# Patient Record
Sex: Female | Born: 1960
Health system: Southern US, Community
[De-identification: ages and names within clinical notes are randomized; demographics above are authoritative.]

## PROBLEM LIST (undated history)

## (undated) DIAGNOSIS — I10 Essential (primary) hypertension: Secondary | ICD-10-CM

---

## 2007-04-25 ENCOUNTER — Emergency Department (HOSPITAL_COMMUNITY): Admission: EM | Admit: 2007-04-25 | Discharge: 2007-04-26 | Payer: Self-pay | Admitting: Emergency Medicine

## 2007-04-29 ENCOUNTER — Ambulatory Visit: Payer: Self-pay | Admitting: Internal Medicine

## 2007-04-29 ENCOUNTER — Inpatient Hospital Stay (HOSPITAL_COMMUNITY): Admission: EM | Admit: 2007-04-29 | Discharge: 2007-04-30 | Payer: Self-pay | Admitting: Internal Medicine

## 2008-01-28 HISTORY — PX: LUNG SURGERY: SHX703

## 2008-07-20 ENCOUNTER — Ambulatory Visit (HOSPITAL_COMMUNITY): Admission: RE | Admit: 2008-07-20 | Discharge: 2008-07-20 | Payer: Self-pay | Admitting: Family Medicine

## 2008-07-21 ENCOUNTER — Inpatient Hospital Stay (HOSPITAL_COMMUNITY): Admission: EM | Admit: 2008-07-21 | Discharge: 2008-08-06 | Payer: Self-pay | Admitting: Emergency Medicine

## 2008-07-25 ENCOUNTER — Ambulatory Visit: Payer: Self-pay | Admitting: Cardiology

## 2008-07-25 ENCOUNTER — Encounter (INDEPENDENT_AMBULATORY_CARE_PROVIDER_SITE_OTHER): Payer: Self-pay | Admitting: Internal Medicine

## 2008-07-29 ENCOUNTER — Ambulatory Visit: Payer: Self-pay | Admitting: Cardiothoracic Surgery

## 2008-07-31 ENCOUNTER — Encounter: Payer: Self-pay | Admitting: Cardiothoracic Surgery

## 2008-08-01 ENCOUNTER — Encounter: Payer: Self-pay | Admitting: Cardiothoracic Surgery

## 2008-08-15 ENCOUNTER — Encounter: Admission: RE | Admit: 2008-08-15 | Discharge: 2008-08-15 | Payer: Self-pay | Admitting: Cardiothoracic Surgery

## 2008-08-15 ENCOUNTER — Ambulatory Visit: Payer: Self-pay | Admitting: Cardiothoracic Surgery

## 2008-08-31 ENCOUNTER — Ambulatory Visit: Payer: Self-pay | Admitting: Cardiothoracic Surgery

## 2008-08-31 ENCOUNTER — Encounter: Admission: RE | Admit: 2008-08-31 | Discharge: 2008-08-31 | Payer: Self-pay | Admitting: Cardiothoracic Surgery

## 2009-08-08 IMAGING — CR DG CHEST 2V
2 series · 2 of 2 positions shown · non-contrast
Comparison: 08/15/2008

CLINICAL DATA: Post surgery for left empyema

CHEST - 2 VIEW

[w chest pa]
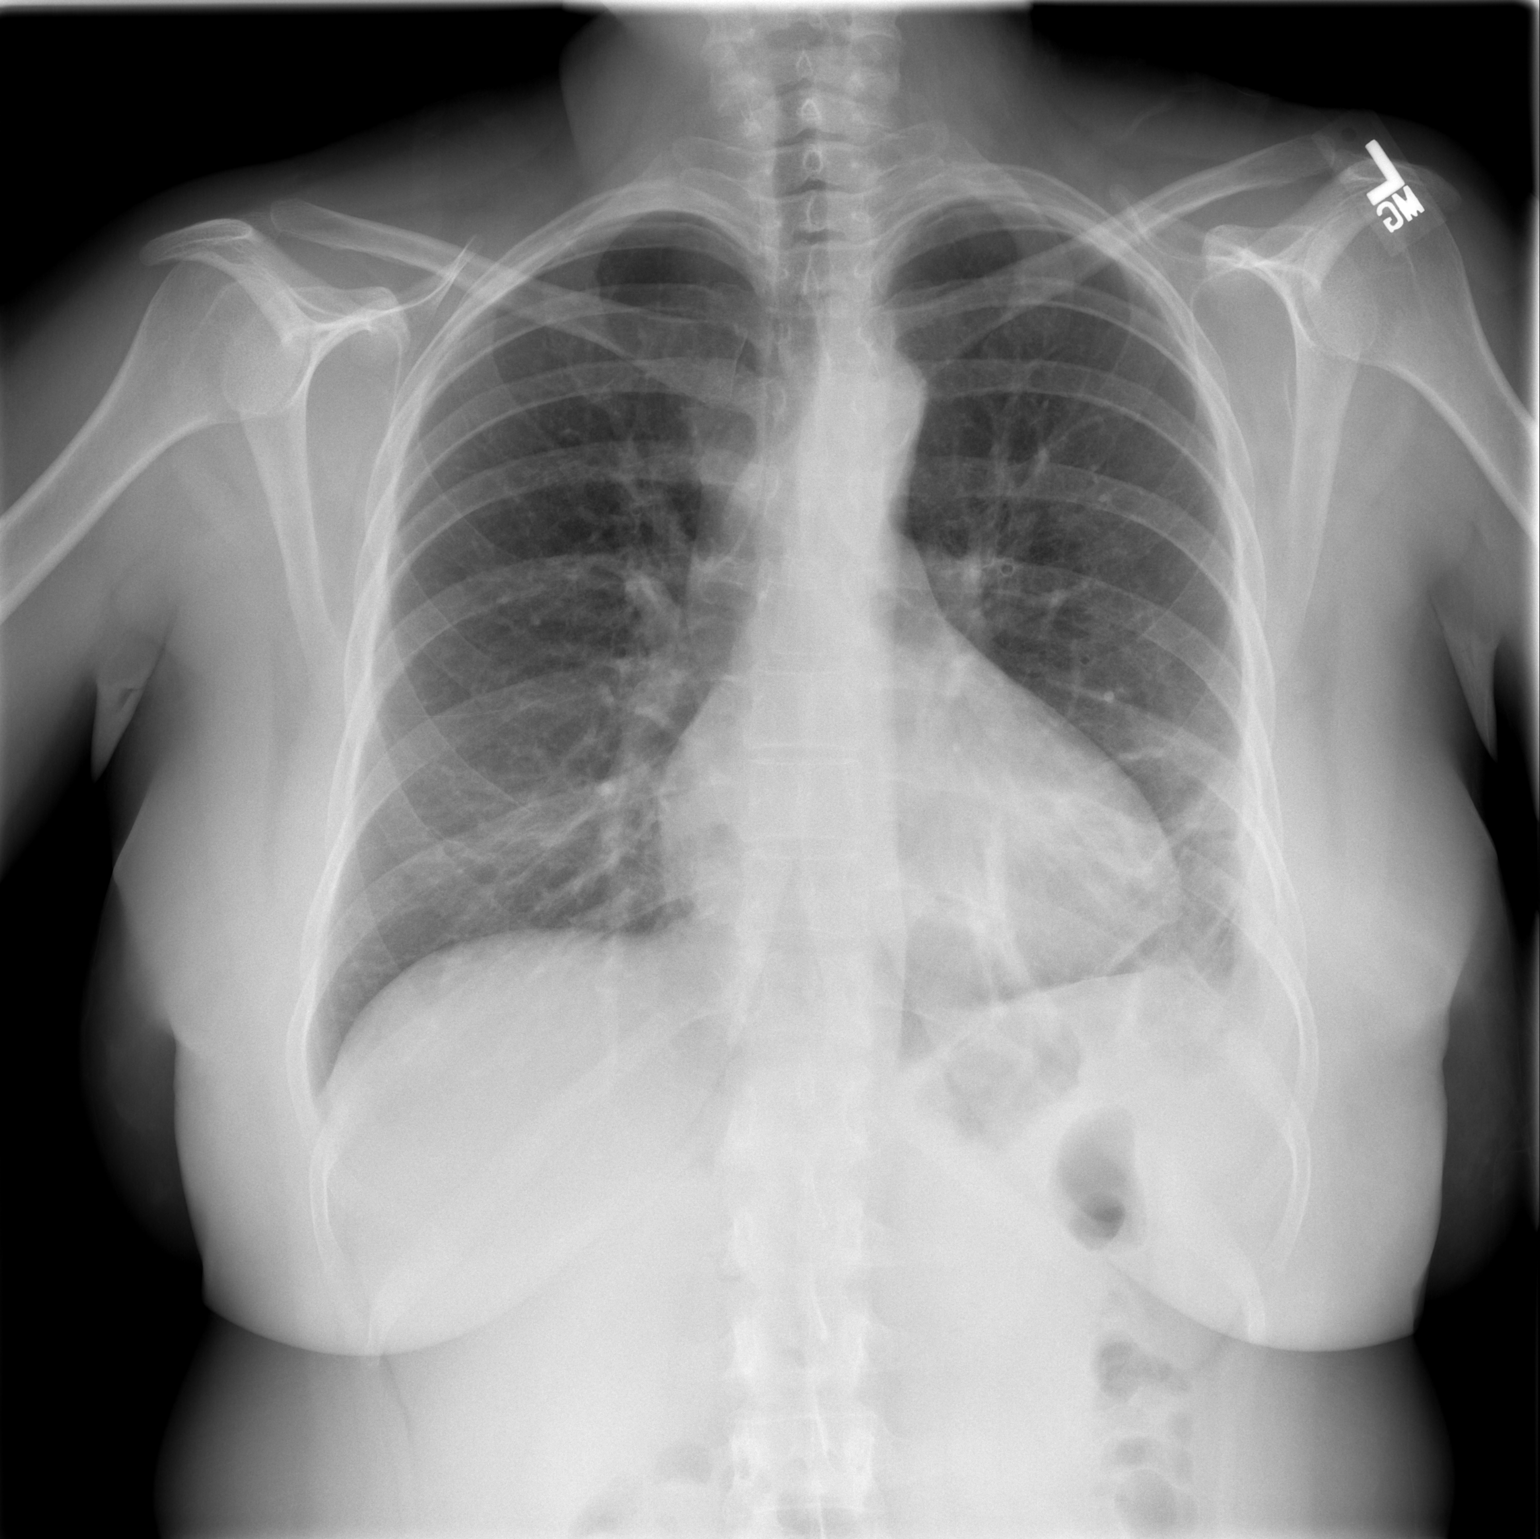

[w chest lat]
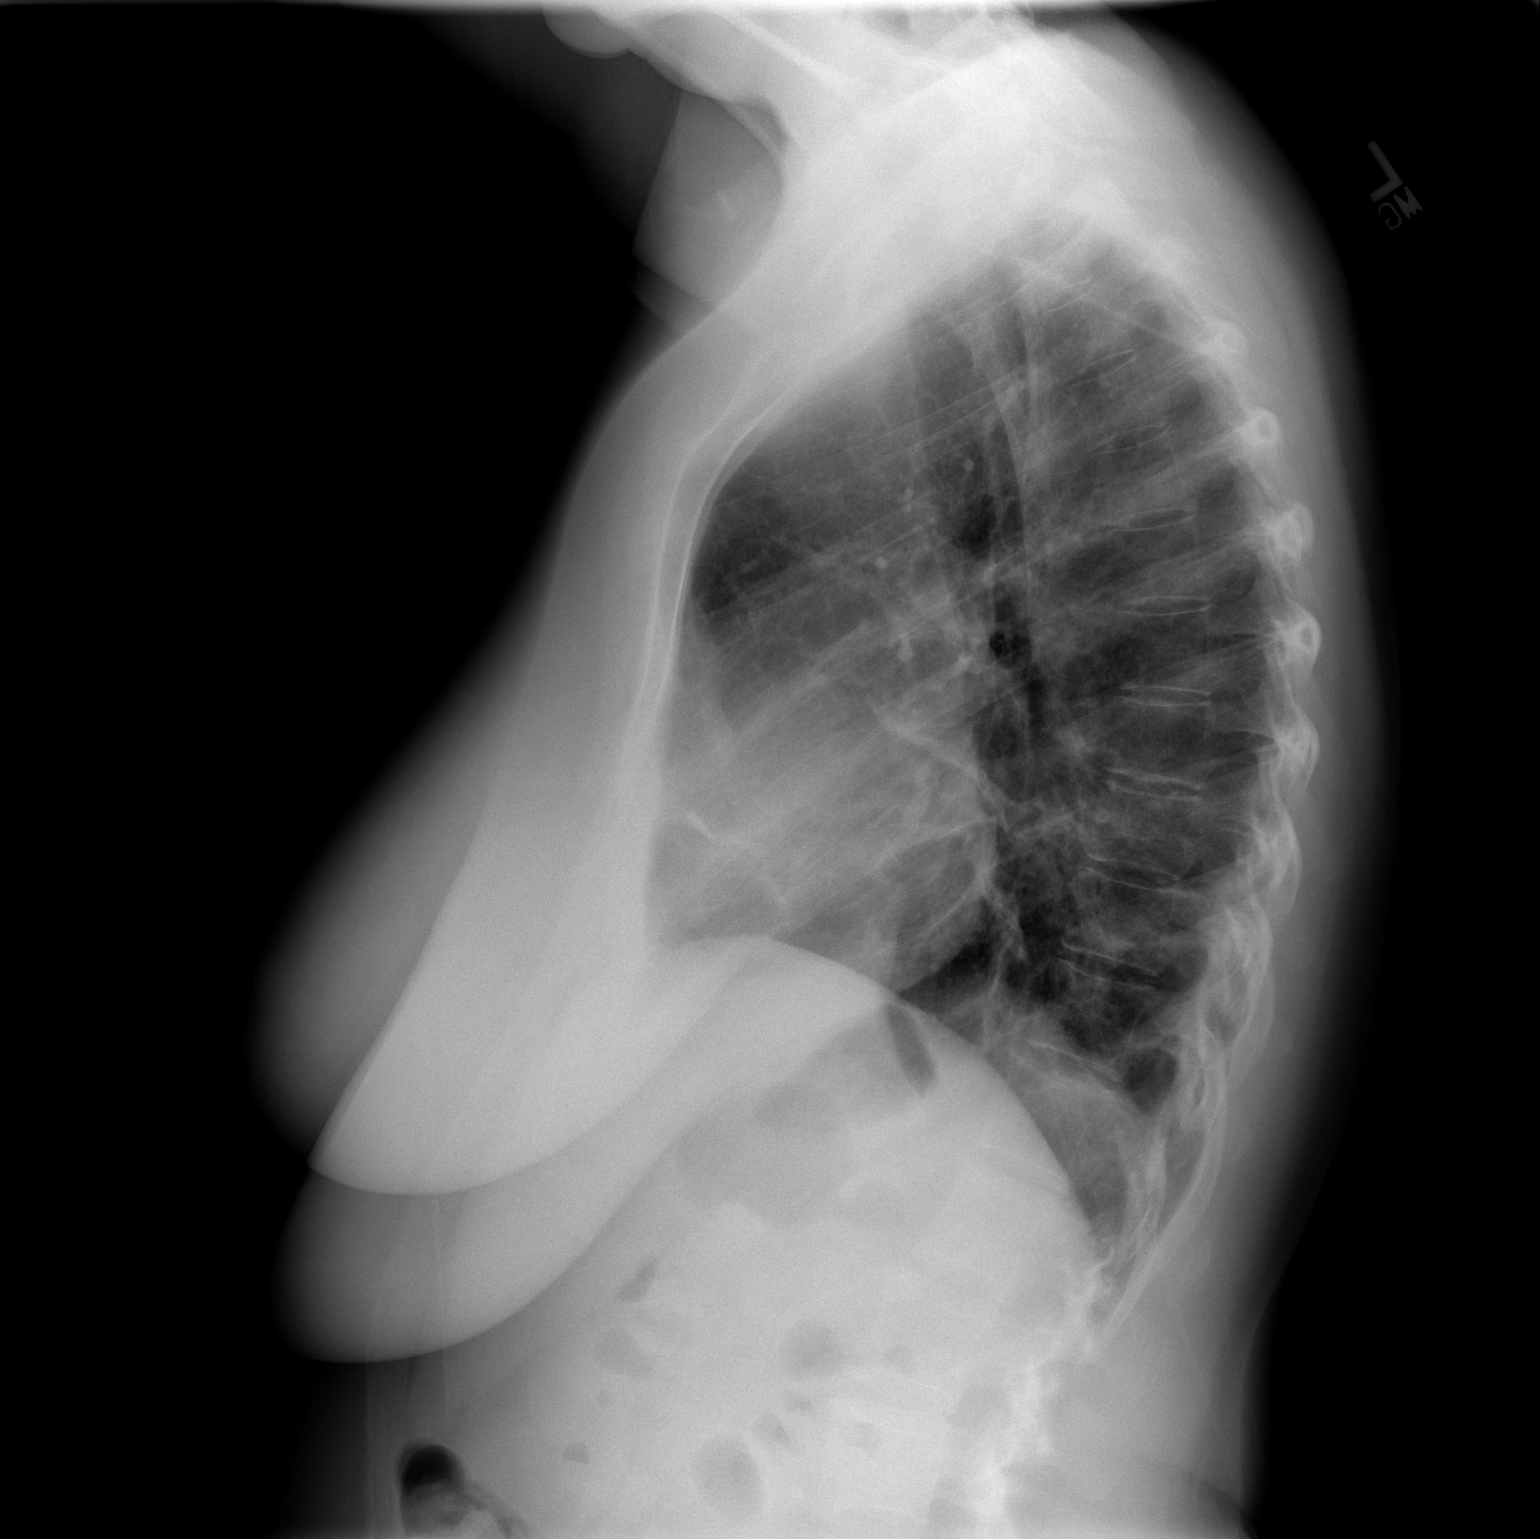

[2 of 2 positions shown; findings below may reference images not displayed]

FINDINGS: Continued improvement of aeration of the left lung base.
There is still minimal scarring/plate-like atelectasis, and minimal
residual pleural reaction.  No new findings or active disease.
Heart and mediastinal contours normal.
IMPRESSION: Continued improvement at the left base - no active process or new
findings.

## 2010-05-05 LAB — CBC
HCT: 30.4 % — ABNORMAL LOW (ref 36.0–46.0)
HCT: 31.2 % — ABNORMAL LOW (ref 36.0–46.0)
HCT: 31.8 % — ABNORMAL LOW (ref 36.0–46.0)
HCT: 34.4 % — ABNORMAL LOW (ref 36.0–46.0)
HCT: 34.9 % — ABNORMAL LOW (ref 36.0–46.0)
HCT: 32.7 % — ABNORMAL LOW (ref 36.0–46.0)
HCT: 35.6 % — ABNORMAL LOW (ref 36.0–46.0)
Hemoglobin: 10.3 g/dL — ABNORMAL LOW (ref 12.0–15.0)
Hemoglobin: 10.8 g/dL — ABNORMAL LOW (ref 12.0–15.0)
Hemoglobin: 10.9 g/dL — ABNORMAL LOW (ref 12.0–15.0)
Hemoglobin: 11.6 g/dL — ABNORMAL LOW (ref 12.0–15.0)
Hemoglobin: 11.9 g/dL — ABNORMAL LOW (ref 12.0–15.0)
Hemoglobin: 11.9 g/dL — ABNORMAL LOW (ref 12.0–15.0)
Hemoglobin: 12.3 g/dL (ref 12.0–15.0)
MCHC: 33.7 g/dL (ref 30.0–36.0)
MCHC: 33.7 g/dL (ref 30.0–36.0)
MCHC: 34.2 g/dL (ref 30.0–36.0)
MCHC: 34.2 g/dL (ref 30.0–36.0)
MCHC: 34.8 g/dL (ref 30.0–36.0)
MCHC: 35.3 g/dL (ref 30.0–36.0)
MCV: 93.9 fL (ref 78.0–100.0)
MCV: 94.3 fL (ref 78.0–100.0)
MCV: 94.3 fL (ref 78.0–100.0)
MCV: 94.7 fL (ref 78.0–100.0)
MCV: 94.9 fL (ref 78.0–100.0)
MCV: 93.6 fL (ref 78.0–100.0)
MCV: 94.3 fL (ref 78.0–100.0)
Platelets: 691 10*3/uL — ABNORMAL HIGH (ref 150–400)
Platelets: 737 10*3/uL — ABNORMAL HIGH (ref 150–400)
Platelets: 756 10*3/uL — ABNORMAL HIGH (ref 150–400)
Platelets: 778 10*3/uL — ABNORMAL HIGH (ref 150–400)
Platelets: 843 10*3/uL — ABNORMAL HIGH (ref 150–400)
Platelets: 718 10*3/uL — ABNORMAL HIGH (ref 150–400)
RBC: 3.21 MIL/uL — ABNORMAL LOW (ref 3.87–5.11)
RBC: 3.29 MIL/uL — ABNORMAL LOW (ref 3.87–5.11)
RBC: 3.37 MIL/uL — ABNORMAL LOW (ref 3.87–5.11)
RBC: 3.67 MIL/uL — ABNORMAL LOW (ref 3.87–5.11)
RBC: 3.7 MIL/uL — ABNORMAL LOW (ref 3.87–5.11)
RBC: 3.47 MIL/uL — ABNORMAL LOW (ref 3.87–5.11)
RBC: 3.56 MIL/uL — ABNORMAL LOW (ref 3.87–5.11)
RDW: 12.9 % (ref 11.5–15.5)
RDW: 13.1 % (ref 11.5–15.5)
RDW: 13.2 % (ref 11.5–15.5)
RDW: 13.4 % (ref 11.5–15.5)
RDW: 13.4 % (ref 11.5–15.5)
RDW: 13.3 % (ref 11.5–15.5)
WBC: 10 10*3/uL (ref 4.0–10.5)
WBC: 10.3 10*3/uL (ref 4.0–10.5)
WBC: 11.8 10*3/uL — ABNORMAL HIGH (ref 4.0–10.5)
WBC: 13.7 10*3/uL — ABNORMAL HIGH (ref 4.0–10.5)
WBC: 8.1 10*3/uL (ref 4.0–10.5)
WBC: 11.2 10*3/uL — ABNORMAL HIGH (ref 4.0–10.5)
WBC: 11.2 10*3/uL — ABNORMAL HIGH (ref 4.0–10.5)
WBC: 9.8 10*3/uL (ref 4.0–10.5)

## 2010-05-05 LAB — BASIC METABOLIC PANEL
BUN: 2 mg/dL — ABNORMAL LOW (ref 6–23)
BUN: 3 mg/dL — ABNORMAL LOW (ref 6–23)
BUN: <1 mg/dL — ABNORMAL LOW (ref 6–23)
CO2: 28 mEq/L (ref 19–32)
CO2: 28 mEq/L (ref 19–32)
CO2: 29 mEq/L (ref 19–32)
CO2: 25 mEq/L (ref 19–32)
Calcium: 7.8 mg/dL — ABNORMAL LOW (ref 8.4–10.5)
Calcium: 8.3 mg/dL — ABNORMAL LOW (ref 8.4–10.5)
Calcium: 8.3 mg/dL — ABNORMAL LOW (ref 8.4–10.5)
Calcium: 8.1 mg/dL — ABNORMAL LOW (ref 8.4–10.5)
Chloride: 100 mEq/L (ref 96–112)
Chloride: 100 mEq/L (ref 96–112)
Chloride: 104 mEq/L (ref 96–112)
Chloride: 108 mEq/L (ref 96–112)
Creatinine, Ser: 0.52 mg/dL (ref 0.4–1.2)
Creatinine, Ser: 0.59 mg/dL (ref 0.4–1.2)
Creatinine, Ser: 0.6 mg/dL (ref 0.4–1.2)
GFR calc Af Amer: >60 mL/min (ref >60)
GFR calc Af Amer: >60 mL/min (ref >60)
GFR calc Af Amer: >60 mL/min (ref >60)
GFR calc Af Amer: >60 mL/min (ref >60)
GFR calc Af Amer: >60 mL/min (ref >60)
GFR calc non Af Amer: >60 mL/min (ref >60)
GFR calc non Af Amer: >60 mL/min (ref >60)
GFR calc non Af Amer: >60 mL/min (ref >60)
GFR calc non Af Amer: >60 mL/min (ref >60)
GFR calc non Af Amer: >60 mL/min (ref >60)
Glucose, Bld: 126 mg/dL — ABNORMAL HIGH (ref 70–99)
Glucose, Bld: 133 mg/dL — ABNORMAL HIGH (ref 70–99)
Glucose, Bld: 136 mg/dL — ABNORMAL HIGH (ref 70–99)
Potassium: 4.1 mEq/L (ref 3.5–5.1)
Potassium: 4.2 mEq/L (ref 3.5–5.1)
Potassium: 4.2 mEq/L (ref 3.5–5.1)
Potassium: 3.6 mEq/L (ref 3.5–5.1)
Sodium: 134 mEq/L — ABNORMAL LOW (ref 135–145)
Sodium: 135 mEq/L (ref 135–145)
Sodium: 138 mEq/L (ref 135–145)
Sodium: 138 mEq/L (ref 135–145)
Sodium: 139 mEq/L (ref 135–145)

## 2010-05-05 LAB — POTASSIUM: Potassium: 3.6 mEq/L (ref 3.5–5.1)

## 2010-05-05 LAB — BLOOD GAS, ARTERIAL
Acid-Base Excess: 1.7 mmol/L (ref 0.0–2.0)
Acid-base deficit: 2.2 mmol/L — ABNORMAL HIGH (ref 0.0–2.0)
Bicarbonate: 23.2 mEq/L (ref 20.0–24.0)
Bicarbonate: 25.2 mEq/L — ABNORMAL HIGH (ref 20.0–24.0)
Drawn by: 280971
O2 Saturation: 90.5 %
O2 Saturation: 91.2 %
Patient temperature: 97.3
Patient temperature: 97.8
TCO2: 24.7 mmol/L (ref 0–100)
TCO2: 26.3 mmol/L (ref 0–100)
pCO2 arterial: 34.8 mmHg — ABNORMAL LOW (ref 35.0–45.0)
pCO2 arterial: 46.6 mmHg — ABNORMAL HIGH (ref 35.0–45.0)
pH, Arterial: 7.314 — ABNORMAL LOW (ref 7.350–7.400)
pH, Arterial: 7.471 — ABNORMAL HIGH (ref 7.350–7.400)
pO2, Arterial: 58.3 mmHg — ABNORMAL LOW (ref 80.0–100.0)
pO2, Arterial: 69 mmHg — ABNORMAL LOW (ref 80.0–100.0)

## 2010-05-05 LAB — APTT: aPTT: 42 seconds — ABNORMAL HIGH (ref 24–37)

## 2010-05-05 LAB — URINALYSIS, MICROSCOPIC ONLY
Bilirubin Urine: NEGATIVE
Glucose, UA: NEGATIVE mg/dL
Hgb urine dipstick: NEGATIVE
Ketones, ur: NEGATIVE mg/dL
Leukocytes, UA: NEGATIVE
Nitrite: NEGATIVE
Protein, ur: NEGATIVE mg/dL
Specific Gravity, Urine: 1.024 (ref 1.005–1.030)
Urobilinogen, UA: 0.2 mg/dL (ref 0.0–1.0)
pH: 6 (ref 5.0–8.0)

## 2010-05-05 LAB — CROSSMATCH
ABO/RH(D): A POS
Antibody Screen: NEGATIVE

## 2010-05-05 LAB — COMPREHENSIVE METABOLIC PANEL
ALT: 35 U/L (ref 0–35)
ALT: 79 U/L — ABNORMAL HIGH (ref 0–35)
AST: 16 U/L (ref 0–37)
AST: 28 U/L (ref 0–37)
Albumin: 1.9 g/dL — ABNORMAL LOW (ref 3.5–5.2)
Albumin: 2.3 g/dL — ABNORMAL LOW (ref 3.5–5.2)
Albumin: 2.5 g/dL — ABNORMAL LOW (ref 3.5–5.2)
Alkaline Phosphatase: 112 U/L (ref 39–117)
Alkaline Phosphatase: 68 U/L (ref 39–117)
Alkaline Phosphatase: 124 U/L — ABNORMAL HIGH (ref 39–117)
BUN: 3 mg/dL — ABNORMAL LOW (ref 6–23)
BUN: <1 mg/dL — ABNORMAL LOW (ref 6–23)
BUN: 4 mg/dL — ABNORMAL LOW (ref 6–23)
CO2: 27 mEq/L (ref 19–32)
CO2: 28 mEq/L (ref 19–32)
Calcium: 7.7 mg/dL — ABNORMAL LOW (ref 8.4–10.5)
Calcium: 8.2 mg/dL — ABNORMAL LOW (ref 8.4–10.5)
Chloride: 102 mEq/L (ref 96–112)
Chloride: 102 mEq/L (ref 96–112)
Chloride: 105 mEq/L (ref 96–112)
Creatinine, Ser: 0.58 mg/dL (ref 0.4–1.2)
Creatinine, Ser: 0.6 mg/dL (ref 0.4–1.2)
Creatinine, Ser: 0.61 mg/dL (ref 0.4–1.2)
GFR calc Af Amer: >60 mL/min (ref >60)
GFR calc Af Amer: >60 mL/min (ref >60)
GFR calc non Af Amer: >60 mL/min (ref >60)
GFR calc non Af Amer: >60 mL/min (ref >60)
GFR calc non Af Amer: >60 mL/min (ref >60)
Glucose, Bld: 117 mg/dL — ABNORMAL HIGH (ref 70–99)
Glucose, Bld: 137 mg/dL — ABNORMAL HIGH (ref 70–99)
Glucose, Bld: 101 mg/dL — ABNORMAL HIGH (ref 70–99)
Potassium: 3.8 mEq/L (ref 3.5–5.1)
Potassium: 3.9 mEq/L (ref 3.5–5.1)
Potassium: 3.7 mEq/L (ref 3.5–5.1)
Sodium: 134 mEq/L — ABNORMAL LOW (ref 135–145)
Sodium: 137 mEq/L (ref 135–145)
Total Bilirubin: 0.4 mg/dL (ref 0.3–1.2)
Total Bilirubin: 0.7 mg/dL (ref 0.3–1.2)
Total Bilirubin: 0.6 mg/dL (ref 0.3–1.2)
Total Protein: 5.1 g/dL — ABNORMAL LOW (ref 6.0–8.3)
Total Protein: 6 g/dL (ref 6.0–8.3)

## 2010-05-05 LAB — DIFFERENTIAL
Basophils Absolute: 0 10*3/uL (ref 0.0–0.1)
Basophils Relative: 0 % (ref 0–1)
Eosinophils Absolute: 0.1 10*3/uL (ref 0.0–0.7)
Eosinophils Relative: 1 % (ref 0–5)
Lymphocytes Relative: 18 % (ref 12–46)
Lymphocytes Relative: 19 % (ref 12–46)
Lymphocytes Relative: 20 % (ref 12–46)
Lymphs Abs: 1.8 10*3/uL (ref 0.7–4.0)
Lymphs Abs: 2.2 10*3/uL (ref 0.7–4.0)
Monocytes Absolute: 0.8 10*3/uL (ref 0.1–1.0)
Monocytes Absolute: 0.8 10*3/uL (ref 0.1–1.0)
Monocytes Relative: 8 % (ref 3–12)
Monocytes Relative: 8 % (ref 3–12)
Neutro Abs: 7.1 10*3/uL (ref 1.7–7.7)
Neutro Abs: 8.2 10*3/uL — ABNORMAL HIGH (ref 1.7–7.7)
Neutrophils Relative %: 73 % (ref 43–77)

## 2010-05-05 LAB — ANAEROBIC CULTURE
Culture: NO GROWTH
Gram Stain: NONE SEEN

## 2010-05-05 LAB — PROTIME-INR
INR: 1.2 (ref 0.00–1.49)
Prothrombin Time: 15.2 seconds (ref 11.6–15.2)

## 2010-05-05 LAB — GLUCOSE, CAPILLARY: Glucose-Capillary: 121 mg/dL — ABNORMAL HIGH (ref 70–99)

## 2010-05-05 LAB — ABO/RH: ABO/RH(D): A POS

## 2010-05-05 LAB — CULTURE, ROUTINE-ABSCESS
Culture: NO GROWTH
Gram Stain: NONE SEEN

## 2010-05-05 LAB — MRSA PCR SCREENING: MRSA by PCR: NEGATIVE

## 2010-05-05 LAB — PREALBUMIN: Prealbumin: 7.1 mg/dL — ABNORMAL LOW (ref 18.0–45.0)

## 2010-05-05 LAB — VANCOMYCIN, TROUGH: Vancomycin Tr: 17.8 ug/mL (ref 10.0–20.0)

## 2010-05-06 LAB — DIFFERENTIAL
Basophils Absolute: 0 10*3/uL (ref 0.0–0.1)
Basophils Absolute: 0 10*3/uL (ref 0.0–0.1)
Basophils Relative: 0 % (ref 0–1)
Basophils Relative: 1 % (ref 0–1)
Eosinophils Absolute: 0 10*3/uL (ref 0.0–0.7)
Eosinophils Absolute: 0 10*3/uL (ref 0.0–0.7)
Eosinophils Relative: 0 % (ref 0–5)
Eosinophils Relative: 0 % (ref 0–5)
Eosinophils Relative: 0 % (ref 0–5)
Lymphocytes Relative: 11 % — ABNORMAL LOW (ref 12–46)
Lymphocytes Relative: 11 % — ABNORMAL LOW (ref 12–46)
Lymphocytes Relative: 13 % (ref 12–46)
Lymphocytes Relative: 8 % — ABNORMAL LOW (ref 12–46)
Lymphs Abs: 1.2 10*3/uL (ref 0.7–4.0)
Lymphs Abs: 1.7 10*3/uL (ref 0.7–4.0)
Lymphs Abs: 1.7 10*3/uL (ref 0.7–4.0)
Lymphs Abs: 1.9 10*3/uL (ref 0.7–4.0)
Monocytes Absolute: 0.9 10*3/uL (ref 0.1–1.0)
Monocytes Absolute: 1 10*3/uL (ref 0.1–1.0)
Monocytes Relative: 3 % (ref 3–12)
Monocytes Relative: 7 % (ref 3–12)
Monocytes Relative: 7 % (ref 3–12)
Neutro Abs: 13.1 10*3/uL — ABNORMAL HIGH (ref 1.7–7.7)
Neutro Abs: 9.9 10*3/uL — ABNORMAL HIGH (ref 1.7–7.7)
Neutrophils Relative %: 82 % — ABNORMAL HIGH (ref 43–77)
Neutrophils Relative %: 84 % — ABNORMAL HIGH (ref 43–77)

## 2010-05-06 LAB — BASIC METABOLIC PANEL
BUN: 4 mg/dL — ABNORMAL LOW (ref 6–23)
BUN: 8 mg/dL (ref 6–23)
CO2: 23 mEq/L (ref 19–32)
CO2: 26 mEq/L (ref 19–32)
Calcium: 8.1 mg/dL — ABNORMAL LOW (ref 8.4–10.5)
Calcium: 8.2 mg/dL — ABNORMAL LOW (ref 8.4–10.5)
Calcium: 8.3 mg/dL — ABNORMAL LOW (ref 8.4–10.5)
Chloride: 103 mEq/L (ref 96–112)
Chloride: 104 mEq/L (ref 96–112)
Chloride: 108 mEq/L (ref 96–112)
Creatinine, Ser: 0.54 mg/dL (ref 0.4–1.2)
Creatinine, Ser: 0.65 mg/dL (ref 0.4–1.2)
GFR calc Af Amer: >60 mL/min (ref >60)
GFR calc Af Amer: >60 mL/min (ref >60)
GFR calc Af Amer: >60 mL/min (ref >60)
GFR calc non Af Amer: >60 mL/min (ref >60)
GFR calc non Af Amer: >60 mL/min (ref >60)
GFR calc non Af Amer: >60 mL/min (ref >60)
GFR calc non Af Amer: >60 mL/min (ref >60)
GFR calc non Af Amer: >60 mL/min (ref >60)
GFR calc non Af Amer: >60 mL/min (ref >60)
Glucose, Bld: 110 mg/dL — ABNORMAL HIGH (ref 70–99)
Glucose, Bld: 135 mg/dL — ABNORMAL HIGH (ref 70–99)
Glucose, Bld: 92 mg/dL (ref 70–99)
Potassium: 3.7 mEq/L (ref 3.5–5.1)
Potassium: 3.9 mEq/L (ref 3.5–5.1)
Potassium: 4 mEq/L (ref 3.5–5.1)
Sodium: 136 mEq/L (ref 135–145)
Sodium: 137 mEq/L (ref 135–145)
Sodium: 137 mEq/L (ref 135–145)
Sodium: 139 mEq/L (ref 135–145)
Sodium: 139 mEq/L (ref 135–145)

## 2010-05-06 LAB — CBC
HCT: 36 % (ref 36.0–46.0)
HCT: 36.6 % (ref 36.0–46.0)
HCT: 43.1 % (ref 36.0–46.0)
Hemoglobin: 12.1 g/dL (ref 12.0–15.0)
Hemoglobin: 12.6 g/dL (ref 12.0–15.0)
Hemoglobin: 12.8 g/dL (ref 12.0–15.0)
Hemoglobin: 15.1 g/dL — ABNORMAL HIGH (ref 12.0–15.0)
MCHC: 35.1 g/dL (ref 30.0–36.0)
MCV: 94.3 fL (ref 78.0–100.0)
MCV: 94.6 fL (ref 78.0–100.0)
Platelets: 303 10*3/uL (ref 150–400)
Platelets: 314 10*3/uL (ref 150–400)
Platelets: 339 10*3/uL (ref 150–400)
Platelets: 381 10*3/uL (ref 150–400)
RBC: 3.67 MIL/uL — ABNORMAL LOW (ref 3.87–5.11)
RBC: 3.79 MIL/uL — ABNORMAL LOW (ref 3.87–5.11)
RBC: 4.57 MIL/uL (ref 3.87–5.11)
RDW: 12.9 % (ref 11.5–15.5)
RDW: 12.9 % (ref 11.5–15.5)
WBC: 12.1 10*3/uL — ABNORMAL HIGH (ref 4.0–10.5)
WBC: 14 10*3/uL — ABNORMAL HIGH (ref 4.0–10.5)
WBC: 14.3 10*3/uL — ABNORMAL HIGH (ref 4.0–10.5)
WBC: 15.4 10*3/uL — ABNORMAL HIGH (ref 4.0–10.5)
WBC: 15.7 10*3/uL — ABNORMAL HIGH (ref 4.0–10.5)

## 2010-05-06 LAB — TSH: TSH: 1.281 u[IU]/mL (ref 0.350–4.500)

## 2010-05-06 LAB — GLUCOSE, CAPILLARY: Glucose-Capillary: 103 mg/dL — ABNORMAL HIGH (ref 70–99)

## 2010-05-06 LAB — CULTURE, RESPIRATORY W GRAM STAIN: Culture: NORMAL

## 2010-05-06 LAB — PROTIME-INR
INR: 1.2 (ref 0.00–1.49)
Prothrombin Time: 15.8 seconds — ABNORMAL HIGH (ref 11.6–15.2)

## 2010-05-06 LAB — APTT: aPTT: 44 seconds — ABNORMAL HIGH (ref 24–37)

## 2010-05-06 LAB — RAPID STREP SCREEN (MED CTR MEBANE ONLY): Streptococcus, Group A Screen (Direct): NEGATIVE

## 2010-06-11 NOTE — Assessment & Plan Note (Signed)
OFFICE VISIT   DENISE, WASHBURN  DOB:  18-Aug-1960                                        August 15, 2008  CHART #:  [id number]   CURRENT PROBLEMS:  1. Status post left VATS decortication for empyema.  2. History of smoking, now reformed.  3. No drug allergies.   PRESENT ILLNESS:  The patient is a 50 year old Caucasian, recent smoker,  who returns for his first office visit after undergoing left VATS  decortication of a complex empyema at the left lung base.  She is doing  well.  She is still taking one pain tablet every night.  There has been  no fever and she is finishing a course of oral Avelox (10 days at home).  She does have some post-thoracotomy symptoms and she does have a yeast  infection from the prolonged antibiotic coverage.  Her operative  cultures did not grow out an organism.  She has not been smoking.   PHYSICAL EXAMINATION:  VITAL SIGNS:  Blood pressure 120/80, pulse 90,  respirations 18, and saturation 90%.  LUNGS:  She has good breath sounds at the left base.  CHEST:  Left VATS incision is well healed.   PA and lateral chest x-ray shows significant clearing of the opacity at  the left base with some mild pleural thickening, but no collection or  fluid.   PLAN:  I told the patient she could resume driving and light activities  for the next 2 weeks.  I gave her another prescription for fluconazole  and I will see her back in approximately 2 weeks on August 31, 2008, for  a final exam and chest x-ray.   Kerin Perna, M.D.  Electronically Signed   PV/MEDQ  D:  08/15/2008  T:  08/16/2008  Job:  [id number]   cc:   Ramon Dredge L. Juanetta Gosling, M.D.

## 2010-06-11 NOTE — Group Therapy Note (Signed)
[name], [name]              ACCOUNT NO.:  [id number]   MEDICAL RECORD NO.:  [id number]          PATIENT TYPE:  INP   LOCATION:  A310                          FACILITY:  [facility]   PHYSICIAN:  Edward L. Juanetta Gosling, M.D.DATE OF BIRTH:  January 26, 1961   DATE OF PROCEDURE:  DATE OF DISCHARGE:                                 PROGRESS NOTE   The patient of the hospitalist.   [name] had ultrasound done, but it did not look like she had much  pleural fluid and had lot more consolidation than we expected.  The  thoracentesis was then cancelled, because of the opacity of pleural  fluid and also did not look like it was septated or anything like that.  They would make me think that it is an empyema.  She looks much better  this morning and said she feels better as well.   Her physical exam shows that temperature is 98.9, pulse 105,  respirations are 18, blood pressure 154/82, and O2 sats 95%.  Her chest  is much clearer than before, and she looks much better.   ASSESSMENT:  I think she is improving.   PLAN:  To continue treatments and follow.      Edward L. Juanetta Gosling, M.D.  Electronically Signed     ELH/MEDQ  D:  07/26/2008  T:  07/27/2008  Job:  [id number]

## 2010-06-11 NOTE — Group Therapy Note (Signed)
[name], [name]              ACCOUNT NO.:  [id number]   MEDICAL RECORD NO.:  [id number]          PATIENT TYPE:  INP   LOCATION:  A308                          FACILITY:  [facility]   PHYSICIAN:  Edward L. Juanetta Gosling, M.D.DATE OF BIRTH:  1960/08/08   DATE OF PROCEDURE:  DATE OF DISCHARGE:                                 PROGRESS NOTE   [name] is much improved.  She said that she took a shower last  night, said that she slept well at night.  She is not coughing much.  She has no new complaints.   PHYSICAL EXAMINATION:  VITAL SIGNS:  Her temperature is 98.6, pulse is  97, respirations 20, blood pressure 125/71, and O2 sat is 93% on 2.5 L.  CHEST:  Clear with a little bit of rales on the left.   Her white blood count has come down.  Her potassium is low and I will  leave that to Primary Care Team.   PLAN:  Then continue with her treatments, chest x-ray in the morning;  and I think she is approaching the point of discharge.      Edward L. Juanetta Gosling, M.D.  Electronically Signed     ELH/MEDQ  D:  07/27/2008  T:  07/27/2008  Job:  [id number]

## 2010-06-11 NOTE — Op Note (Signed)
NAMEANYELI, HOCKENBURY NO.:  [id number]   MEDICAL RECORD NO.:  [id number]           PATIENT TYPE:   LOCATION:                                 FACILITY:   PHYSICIAN:  Kerin Perna, M.D.  DATE OF BIRTH:  1960-03-08   DATE OF PROCEDURE:  07/31/2008  DATE OF DISCHARGE:                               OPERATIVE REPORT   OPERATION:  Left VATS, decortication of empyema.   PREOPERATIVE DIAGNOSIS:  Loculated left empyema.   POSTOPERATIVE DIAGNOSIS:  Loculated left empyema.   SURGEON:  Kerin Perna, MD   ASSISTANT:  Coral Ceo, PA   ANESTHESIA:  General.   INDICATIONS:  The patient is a 50 year old Caucasian smoker who was  hospitalized at [facility] in [place] for a pneumonia.  While  hospitalized, she developed a large left pleural effusion which was  unable to be drained with thoracentesis.  She was transferred to [facility] where a CT scan showed a pleural collection consistent  with empyema.  She had been on antibiotics with some improvement in her  symptoms of chest wall pain, but she still remained dependant on oxygen  and had partial opacification of the left hemithorax.  It was felt that  a surgical decortication was indicated.  I discussed the procedure in  detail with the patient and her family including the indications,  benefits, alternatives, surgical incision, use of chest tube drainage  system, and expected postoperative recovery.  I discussed the risks  including air leak, recurrent empyema, bleeding, blood transfusion  requirement.  She understood and agreed to proceed with surgery.   PROCEDURE:  The patient was brought to the operating room and placed  supine on the operating table.  Gentle time-out was performed to confirm  proper site and proper patient.  General anesthesia was induced.  The  patient was turned to expose the left chest which was prepped and draped  as a sterile field.  A small incision was made at the base  of the  scapula and the pleural space entered.  A VATS camera was inserted;  however, the pleural space was obliterated with adhesions.  A small  incision was extended from the portal incision measuring approximately 6  cm.  The ribs were gently retracted, and the pleural space was then  carefully debrided.  There is a large amount of solid and loculated  fluid with glue-like characteristics consistent with empyema.  A peel of  the lower lobe and upper lobe was removed.  There was some raw areas on  the upper lobe of the fissure, and these were closed with figure-of-  eight chromic sutures.  The lung was mobilized off the diaphragm, the  mediastinum, as well as the apex.  The pleural peel was completely  removed, and a hemithorax irrigated with warm saline.  Two drainage  tubes were then placed into the hemithorax and brought out through  separate incisions and secured to the skin.  These tubes were  subsequently connected to a Pleur-Evac drainage system.   Interrupted #2 Vicryl was used to close the ribs.  After the lung was  inflated under direct vision and filled the chest space well.  The  muscle layers were closed with interrupted #1 Vicryl.  The subcutaneous  and skin were closed in a running Vicryl.  After the incision had been  closed, an On-Q catheter was placed in the subcutaneous tissue adjacent  to the incision and secured to the skin and connected to a Marcaine  reservoir.      Kerin Perna, M.D.  Electronically Signed     PV/MEDQ  D:  07/31/2008  T:  08/01/2008  Job:  [id number]   cc:   Ramon Dredge L. Juanetta Gosling, M.D.  Patrica Duel, M.D.

## 2010-06-11 NOTE — Group Therapy Note (Signed)
[name], [name]              ACCOUNT NO.:  [id number]   MEDICAL RECORD NO.:  [id number]          PATIENT TYPE:  INP   LOCATION:  A310                          FACILITY:  [facility]   PHYSICIAN:  Melissa L. Ladona Ridgel, MD  DATE OF BIRTH:  July 08, 1960   DATE OF PROCEDURE:  07/22/2008  DATE OF DISCHARGE:                                 PROGRESS NOTE   Subjectively, the patient is resting comfortably in her bed.  She is  improved today, but only slightly.  She states that earlier she had a  coughing bout, which really made her hurt terribly and become short of  breath, but that is a sign of improvement as it appears that the  secretions and mucus are starting to break up.  The patient states that  she feels she probably can advance her diet.  She did have a vomiting  episode, but it was more precipitated by a really bad coughing spell and  feeling like the mucus was stuck in the back of her throat.  The patient  is otherwise noted to have had a low-grade fever of 99 early this  morning.  Overall, she looks stronger and less ill but is still quite  sick with this bilateral pneumonia   PHYSICAL EXAMINATION:  VITAL SIGNS TODAY:  Temperature this morning was  99, blood pressure of 117/63, pulse 96, respirations 18, saturation is  92 to 94% on 2 to 3 L.  INTAKE AND OUTPUT:  She had 3582 in yesterday and several voids.  She  has had no bowel movements at this time.  GENERAL:  This is a moderately overweight white female, who is currently  in no acute distress, although she does have the propensity for becoming  acutely exacerbated if she coughs or has deep breaths, which causes her  to have pain.  HEENT:  She is normocephalic, atraumatic.  Pupils equal, round, and  reactive to light.  Extraocular muscles are intact.  She has anicteric  sclerae.  Examination of the nose reveals septum midline.  Examination  of the mouth reveals loss of front teeth, moist mucous membranes, no  obvious exudate in  the mouth, and no lesions on the lips.  NECK:  Supple, there is no JVD, no lymph nodes, no carotid bruits.  CHEST:  Decreased with coarse crackles at the right base, no wheezing,  dull at the left base but has improved air entry, and no wheezing.  CARDIOVASCULAR:  Regular rate and rhythm, positive S1 S2, no S3 S4.  No  murmurs, rubs, or gallops.  ABDOMEN:  Soft, minimally tender in the left upper quadrant likely from  irritation of the diaphragm on that side related to a pleural effusion.  Otherwise, there is no guarding or rebound.  She has positive bowel  sounds.  EXTREMITIES:  No clubbing, cyanosis, or edema.  NEUROLOGICAL:  She is awake, alert, and oriented.  Cranial nerves II-XII  are intact.  Power is 5/5 and DTRs 2+, and plantars are downgoing.   PERTINENT LABORATORY VALUES:  A sodium of 136, potassium 4.0, chloride  105, CO2 is 25,  BUN is 8, creatinine 0.64, glucose was 135.  Her CBC  reveals a white count of 15.4 thousand, which is only slightly down from  15.7 on admission.  Her hemoglobin is 12.8 with a hematocrit of 36.4,  and platelets of 303.  A rapid Strep was negative.   ASSESSMENT AND PLAN:  This is a 50 year old white female with a  bilateral basilar pneumonia considered to be a community-acquired  source.  She is currently on ceftriaxone and azithromycin, day 2.  She  has started to improve with breaking up of the infiltrates and coughing  up mucus.  A sputum culture is pending.  She is starting to feel a  little bit hungry and would like to advance her diet.  1. Bilateral community-acquired pneumonia.  Continue antibiotics day      #2, ceftriaxone and azithromycin, with aggressive pulmonary toilet      using a flutter valve, Mucinex nebulizers.  2. Tobacco abuse.  The patient will have a Nicoderm patch and we will      continue to provide her with cessation counseling.  3. Nausea is improved.  We will treat her symptomatically.  4. Deep venous thrombosis (DVT)  prophylaxis will be with heparin and      Lovenox.   Total time with this patient is 20 minutes      Melissa L. Ladona Ridgel, MD  Electronically Signed     MLT/MEDQ  D:  07/22/2008  T:  07/22/2008  Job:  [id number]

## 2010-06-11 NOTE — Discharge Summary (Signed)
[name], MCDIARMID NO.:  [id number]   MEDICAL RECORD NO.:  [id number]          PATIENT TYPE:  INP   LOCATION:  3302                         FACILITY:  [facility]   PHYSICIAN:  Kerin Perna, M.D.  DATE OF BIRTH:  [birth year]   DATE OF ADMISSION:  07/29/2008  DATE OF DISCHARGE:  08/05/2008                               DISCHARGE SUMMARY   ADDENDUM   The patient was transferred over to [facility] from [facility]  on July 29, 2008.  She was seen and evaluated by Dr. Donata Clay.  The  patient was continued on IV antibiotics.  Dr. Donata Clay discussed with  the patient undergoing left VATS with drainage of empyema.  He discussed  risks and benefits with the patient.  The patient acknowledged her  understanding and agreed to proceed.  Surgery was scheduled for August 01, 2008.  The patient remained on IV antibiotics preoperatively.   The patient was taken to the operating room on July 31, 2008, where she  underwent left video-assisted thoracoscopic surgery with left mini-  thoracotomy, drainage of empyema and decortication.  The patient's  cultures were negative.  Pathology report showed no evidence of  malignancy.  Postoperatively, the patient was able to be extubated.  Post extubation, she was noted to be alert and oriented x4, neuro  intact.  She was noted to be hemodynamically stable.  The patient's  postoperative course, daily chest x-rays were obtained.  The patient had  no air leak noted from chest tube.  She had minimal drainage from the  chest tube.  Posterior chest tube was discontinued on postop day #2 with  remaining chest tube discontinued on postop day #4.  We will plan obtain  PA and lateral chest x-ray in the a.m. prior to discharge to home.  The  patient had remained on IV antibiotics.  Plan is to discharge her on  Avelox p.o. at home.  The patient remained hemodynamically stable.  All  vital signs stable.  She remained in normal sinus rhythm.  She  was  encouraged to use incentive spirometer and able to be weaned off oxygen,  saturating greater than 90% on room air.  All incisions were clean, dry,  and intact and healing well.   On postop day #4, August 04, 2008, the patient's vital signs were stable.  Her most recent lab work shows sodium of 134, potassium 4.2, chloride of  100, bicarbonate 28, BUN of 2, creatinine 0.6, and glucose 133.  White  blood cell count 8.1, hemoglobin 11.6, hematocrit 34.4, and platelet  count 843.  The patient is tentatively ready for discharge to home in  the a.m. on postop day #5, August 05, 2008.   FOLLOWUP APPOINTMENTS:  A followup appointment has been arranged with  Dr. Donata Clay for August 18, 2008, at 4 o'clock p.m.  The patient will  need to obtain PA and lateral chest x-ray 30 minutes prior to this  appointment.  The patient has a suture removal appointment with a nurse  for August 11, 2008, at 10:30 a.m.   ACTIVITY:  The patient is instructed no driving until released to do so,  no lifting over 10 pounds.  She is told to ambulate 3-4 times per day,  progress as tolerated and continue her breathing exercises.   INCISIONAL CARE:  The patient is told to shower washing her incisions  using soap and water.  She is to contact the office if she develops any  drainage or opening from any of her incision sites.   DIET:  The patient is educated on diet to be low fat, low salt.   DISCHARGE MEDICATIONS:  1. Nicotine patch 21 mg for 24 hours change daily.  2. Guaifenesin 600 mg b.i.d.  3. Avelox 400 mg daily x10 days.  4. Percocet 5/325 one to two tabs q.4-6 h. p.r.n. pain.  5. One can Ensure 3 times per day.      Sol Blazing, PA      Kerin Perna, M.D.  Electronically Signed    KMD/MEDQ  D:  08/04/2008  T:  08/04/2008  Job:  [id number]   cc:   Ramon Dredge L. Juanetta Gosling, M.D.  Patrica Duel, M.D.

## 2010-06-11 NOTE — Group Therapy Note (Signed)
NAMEDEONNE, ROOKS              ACCOUNT NO.:  [id number]   MEDICAL RECORD NO.:  [id number]          PATIENT TYPE:  INP   LOCATION:  A308                          FACILITY:  [facility]   PHYSICIAN:  Edward L. Juanetta Gosling, M.D.DATE OF BIRTH:  March 11, 1960   DATE OF PROCEDURE:  07/28/2008  DATE OF DISCHARGE:                                 PROGRESS NOTE   Patient of the hospitalist team.   SUBJECTIVE:  Ms. Console says she is feeling better.  She has no other  new complaints.  She is set for chest x-ray today, but she says she is  able to get around okay.   PHYSICAL EXAMINATION:  VITAL SIGNS:  Temperature is 97.7, pulse is 94,  respirations 24, blood pressure 120/75, and O2 sats 93% on 2 L.  CHEST:  Clear.  HEART:  Regular.   ASSESSMENT:  I think she is doing better.   PLAN:  I think it is probably going to be okay for her to be discharged  after she has a chest x-ray depending on the results of the chest x-ray.  She is going to be set up with home O2, etc.  Her O2 sat dropped into  the 70s yesterday on room air.      Edward L. Juanetta Gosling, M.D.  Electronically Signed     ELH/MEDQ  D:  07/28/2008  T:  07/29/2008  Job:  [id number]

## 2010-06-11 NOTE — H&P (Signed)
[name], [name] NO.:  [id number]   MEDICAL RECORD NO.:  [id number]          PATIENT TYPE:  INP   LOCATION:  6707                         FACILITY:  [facility]   PHYSICIAN:  Beckey Rutter, MD  DATE OF BIRTH:  1960-12-11   DATE OF ADMISSION:  04/28/2007  DATE OF DISCHARGE:                              HISTORY & PHYSICAL   PRIMARY CARE PHYSICIAN:  Unassigned.   CHIEF COMPLAINT:  Joint swelling.   HISTORY OF PRESENT ILLNESS:  This is a 50 year old very pleasant  Caucasian female with no significant past medical history presented  today after a primary care visit with a chief complaint of bilateral  ankle swelling and rash.  The patient was in her usual state of health  up to 5 days ago when she started to have fevers.  The patient then felt  joint pains, mainly on her hip joints.  The patient also started to have  some rash.  At that time she visited her primary physician who  prescribed doxycycline for her.  The patient came to the emergency  department the next day because of worsening of the skin rash.  The  patient was taken off of doxycycline and she was given Zithromax for  bibasilar pneumonia on the chest x-ray.  The patient went home, but  today she experienced swelling and difficulty walking, so she went back  to the primary physician.  The patient then was sent again for  evaluation of the joint swelling.  She denied fever today.  She denied  nausea, vomiting.  The patient had being active outside in her  boyfriend's farm, but she denied any tick bite.  The patient also denied  sick contact.  She denied headache and diaphoresis.   PAST MEDICAL HISTORY:  The patient has no significant past medical  history.   SOCIAL HISTORY:  Tobacco abuse one and a half packs per day recently.  No drug abuse.  No ethanol abuse.   FAMILY HISTORY:  Noncontributory.   MEDICATIONS:  1. Doxycycline two doses.  2. Zithromax, she took also about three doses.   REVIEW OF SYSTEMS:  A 12 point review of system is unrevealing.  The  rest per HPI.   PHYSICAL EXAMINATION:  VITAL SIGNS:  Temperature 97.2, blood pressure  129/72, pulse 92, respiratory rate 16.  HEAD:  Atraumatic, normocephalic.  HEENT:  PERRL.  Mouth moist.  No ulcer.  NECK:  Supple.  No JVD.  LUNGS:  Bilateral fair air entry.  No adventitious sounds.  CARDIOVASCULAR:  Precordium presents heart sounds audible.  ABDOMEN:  Soft, nontender.  Bowel sounds present.  EXTREMITIES:  The patient had a swelling around the ankles bilaterally  and also in the feet.  SKIN:  The patient has very tiny/petechial rash in the lower  extremities.  No obvious rash on the back.   LABS AND X-RAY:  The patient had chest x-ray on her yesterday with visit  to the ER.  At that time the impression was bibasilar airspace disease  which might be due to atelectasis or pneumonia.  Her lab test today  showing ESR 24.  Microscopic urine showing few bacteria.  Urinalysis  yellow, cloudy, negative nitrate and small leukocyte esterase.  Sodium  136, potassium 3.7, chloride 104, bicarb 24, glucose is 111, BUN 6,  creatinine 0.76.  White blood count is 8.4, hemoglobin is 13.9,  hematocrit is 40.6 and platelet count is 284.   ASSESSMENT AND PLAN:  There is a 50 year old female with fever, rash and  joint swelling.  The differential diagnosis is [facility] spotted  fever versus viral syndrome with a drug rash.   PLAN:  1. The patient will be admitted for observation.  2. Will check the patient's ESR, C-reactive protein and indirect      fluorescent antibody for [facility] spotted fever.  3. We will obtain infectious disease consultation.  4. For DVT prophylaxis, will consider Lovenox.  For GI prophylaxis,      will start Protonix.      Beckey Rutter, MD  Electronically Signed     EME/MEDQ  D:  04/28/2007  T:  04/29/2007  Job:  [id number]

## 2010-06-11 NOTE — Group Therapy Note (Signed)
[name], [name]              ACCOUNT NO.:  [id number]   MEDICAL RECORD NO.:  [id number]          PATIENT TYPE:  INP   LOCATION:  A310                          FACILITY:  [facility]   PHYSICIAN:  Melissa L. Ladona Ridgel, MD  DATE OF BIRTH:  01-15-1961   DATE OF PROCEDURE:  07/24/2008  DATE OF DISCHARGE:                                 PROGRESS NOTE   SUBJECTIVE:  The patient continues to do well.  She is helping with her  activities of daily living.  She feels less pleuritic chest pain.  She  still is tachycardiac at rest and especially with exertion.  She is not  moving exceptional amounts of air and appears dull at the bases on exam.  I did review her chest x-ray which does confirm that she has an  increasing effusion left base and persistent infiltrates.  Clinically,  however, she is slightly improved.  She remains with a slight  temperature this afternoon of 100.7, blood pressure is 147/78, pulse is  99-122.  She has had reasonable urine output and one stool.   PHYSICAL EXAMINATION:  GENERAL:  This is a slightly overweight white  female in mild distress secondary to shortness of breath.  She states  she did not sleep last night.  Otherwise, she is normocephalic,  atraumatic.  Pupils equal, round and reactive to light.  Extraocular  muscles intact.  Mucous membranes are moist.  NECK:  Supple.  There is no JVD.  I do not appreciate any  lymphadenopathy.  There is no thyromegaly.  CHEST:  Dull at both bases, more so on the left than the right.  I did  not appreciate any wheezes today.  I also do not appreciate any  movements of any rhonchi or rales like I did the other day.  She has  less pleuritic chest pain today.  CARDIOVASCULAR:  Tachycardic.  Positive S1-S2.  No S3-S4.  No murmurs,  rubs or gallops.  ABDOMEN:  Obese, nontender, nondistended with positive  bowel sounds.  EXTREMITIES:  Show no clubbing, cyanosis or edema.  NEUROLOGICAL:  She is awake, alert and oriented.  Cranial  nerves II-XII  are intact.  Power is 5/5.  DTRs 2+.  Plantars are downgoing.   PERTINENT LABORATORIES:  She has normal oropharyngeal flora in her  sputum.  Her sodium is 136, potassium 3.3, chloride 104, CO2 of 23, BUN  is 4, creatinine 0.54 and a glucose of 110.  CBC shows a white count of  14.3 with a hemoglobin of 12.8, hematocrit 36.6 and platelets of 381.   ASSESSMENT/PLAN:  This is a pleasant 50 year old female with bilateral  pneumonia.  She is a tobacco abuser and presented with fairly  significant chest pain and hypoxia.  The patient has persisted with  hypoxia and is still fairly dyspneic.   1. Pneumonia.  Despite aggressive pulmonary toilet and antibiotics,      the patient still has not defervesced.  I will consult Dr. Juanetta Gosling      for the morning for evaluation of her pleural effusion and I will      add  vancomycin at this time.  She is currently day #3 of community-      acquired pneumonia coverage with ceftriaxone and azithromycin.  I      will check a BMP in the morning.  2. Tobacco abuse.  Cessation counseling has been provided.  A Nicoderm      patch has been provided.  3. Hypokalemia.  I will go ahead and replete her orally.  4. Tachycardia with cardiomegaly.  I am going to go ahead and check a      2-D echo just to make sure that we are not missing a cardiac source      for the increase in infiltrates.  Her tachycardia is definitely      physiological in      response to the severe pneumonia that she has, but we will make      sure there is no underlying cardiac recent for that.   Total time on this case is 30 minutes.      Melissa L. Ladona Ridgel, MD  Electronically Signed     MLT/MEDQ  D:  07/24/2008  T:  07/25/2008  Job:  [id number]

## 2010-06-11 NOTE — Group Therapy Note (Signed)
[name], [name]              ACCOUNT NO.:  [id number]   MEDICAL RECORD NO.:  [id number]          PATIENT TYPE:  INP   LOCATION:  A310                          FACILITY:  [facility]   PHYSICIAN:  Melissa L. Ladona Ridgel, MD  DATE OF BIRTH:  1960-04-06                                 PROGRESS NOTE   Subjectively, the patient states that she slept a little better last  night.  She continued to eat well.  She is still hurting on the left  side of her chest and is very short of breath and tachycardic with  exertion.  Otherwise, she is continuing to improve slowly.  T max  yesterday afternoon 100.7.  This morning, in the 90's, 98.8.  Blood  pressure is 136/69, pulse is ranging from 100 to 115.  Saturations 89 to  93% on 3 liters.  Her intake and output 1125 in.  Output is not  recorded.  However, she has had at least 10 episodes of urination in 1  stool.   PHYSICAL EXAMINATION:  GENERAL:  This is an obese white female in less distress than she has  been.  Normocephalic and atraumatic.  Pupils are equal, round and reactive to  light.  Extraocular muscles are intact.  She has anicteric sclerae.  Examination of the nose reveals septum midline.  No discharge externally  but the patient feels she has some mucus coming down her throat.  She  has no oral lesions.  She does have poor dentition.  No lip lesions.  NECK:  Supple.  There is no JVD, lymphadenopathy, or carotid bruits.  CHEST:  Some increased air movement, especially on the left lung, with  coarse crackles.  No wheezes.  She is still decreased at the bases and  dull.  CARDIOVASCULAR:  Tachycardic.  Positive S1 and S2.  No S3, S4, murmurs,  rubs or gallops.  ABDOMEN:  Obese, nontender, nondistended with positive bowel sounds.  There is no hepatosplenomegaly.  No guarding or rebound.  EXTREMITIES:  No clubbing, cyanosis or edema.  NEUROLOGIC:  She is awake, alert and oriented.  Cranial nerves II-XII  are intact.  The power appears to be 5/5.   She is not tremulous.  PSYCHIATRIC:  Affect is up and good.  She is appropriate.  Recent and  remote memory are intact.  Judgment and insight are intact.   PERTINENT LABORATORY:  Her PTT is 44.  PT/INR is 10.8 and 1.2.  Respiratory culture has gram  positive cocci in pairs and normal flora.  BNP was 88.7.  Sodium is 137,  potassium 3.9, chloride 108, CO2 26, BUN 3, creatinine 0.65, glucose  109.   ASSESSMENT/PLAN:  This is a 50 year old female with bilateral pneumonia.  She is a  significant tobacco abuser, and she presented with fairly significant  chest pain and hypoxia.  The patient persists in being dyspneic with  exertion, hypoxic, and tachypneic, but overall, she has improved since  the first day but is still showing fairly significant infiltrates on her  chest x-ray.  1. Pneumonia:  The patient continues to have low grade fever.  I  have      added vancomycin, and I have consulted Dr. Juanetta Gosling.  I await his      consultation.  The patient is currently day 4 of community-acquired      pneumonia coverage with ceftriaxone, azithromycin, and vancomycin      was added yesterday.  So she is day #1.  Her BNP is within normal      limits.  A 2D echo is pending.  2. Tobacco abuse:  Cessation counseling has been provided, and she      continues on Nicoderm.  3. Hypokalemia:  Improved today, up to 3.7.  We will continue to      replete her.  4. Tachycardia with cardiomegaly:  I am going to go ahead and check a      2D echo, which was done, and we will see if there is any cardiac      source for her infiltrates.   Total time on this case is 20 minutes.      Melissa L. Ladona Ridgel, MD  Electronically Signed     MLT/MEDQ  D:  07/25/2008  T:  07/25/2008  Job:  [id number]

## 2010-06-11 NOTE — Group Therapy Note (Signed)
NAMEKEERSTIN, BJELLAND              ACCOUNT NO.:  [id number]   MEDICAL RECORD NO.:  [id number]          PATIENT TYPE:  INP   LOCATION:  A308                          FACILITY:  [facility]   PHYSICIAN:  Edward L. Juanetta Gosling, M.D.DATE OF BIRTH:  May 12, 1960   DATE OF PROCEDURE:  DATE OF DISCHARGE:                                 PROGRESS NOTE   Ms. Flinders had chest x-ray yesterday that still shows an area with a  effusion.  Dr. Janice Norrie has discussed this with the thoracic surgeons in  [facility] and plans have been made to transfer her to thoracic surgery  in [place], which I think is appropriate.  She may need if that.  The  area did appear to be loculated.  I will of course sign off at this  point.      Edward L. Juanetta Gosling, M.D.  Electronically Signed     ELH/MEDQ  D:  07/29/2008  T:  07/29/2008  Job:  [id number]

## 2010-06-11 NOTE — Consult Note (Signed)
[name], KARN NO.:  [id number]   MEDICAL RECORD NO.:  [id number]          PATIENT TYPE:  INP   LOCATION:  2010                         FACILITY:  [facility]   PHYSICIAN:  Kerin Perna, M.D.  DATE OF BIRTH:  Apr 01, 1960   DATE OF CONSULTATION:  07/29/2008  DATE OF DISCHARGE:                                 CONSULTATION   REQUESTING PHYSICIAN:  Oneal Deputy. Juanetta Gosling, M.D.   REASON FOR CONSULTATION:  Empyema (left).   HISTORY OF PRESENT ILLNESS:  I was asked to evaluate this 50 year old  Caucasian smoker for possible decortication and drainage of a recently  diagnosed left empyema.  After a week of feeling poor with weakness,  shortness of breath and chest discomfort, she was admitted for 1 week at  [facility] with severe left lateral chest pain.  She had been  started on oral antibiotics (doxycycline) and started on Vicodin by her  primary care physician without significant improvement.  A CT scan was  performed on June 25 which showed no evidence of pulmonary embolus and  evidence of an infiltrate in the left lower lobe.  She was started on  increased antibiotic coverage including vancomycin and initially  Rocephin for a community acquired pneumonia.  This was then changed to  Augmentin, vancomycin, and Vibramycin.  Her pain improved in the  hospitalization, however, her serial chest x-ray showed gradual  accumulation of an enlarging  pleural effusion.  She had a low grade  fever and her white count was elevated at 14,000.  An attempted left  thoracentesis was performed on 06/29 which was unsuccessful due to  inability to withdraw fluid and the impression that she had  consolidative process.  Her oxygen saturation was borderline and she was  on some nasal cannula oxygen supplementation.  She was tachycardic  and  a TEE  echo was performed which showed no significant pericardial  effusion and LV function was normal without significant valvular  disease.   On July 2, a chest x-ray was taken which showed a large left  pleural effusion and it was felt she needed a thoracic surgical  evaluation.  She was transferred from [facility] today.  A CT scan of the chest was repeated here which shows significant  accumulation a large left pleural effusion with hyperenhancing borders  consistent with empyema.  The right lung appears to be clear.  The  patient is admitted for thoracic surgical intervention - decortication  of the empyema.   PAST MEDICAL HISTORY:  1. No prior hospitalizations except for childbirth.  2. No drug allergies.  3. No medications.   FAMILY HISTORY:  Positive for Alzheimer's disease and cancer.   SOCIAL HISTORY:  She is unemployed and smokes two packs a day.   REVIEW OF SYSTEMS:  No significant fever or weight change recently.  No  headache or visual changes.  She denies any active dental problems or  difficulty swallowing.  She has had chest pain associated with this  acute illness on the left lateral flank and left shoulder.  She has a  nonproductive cough.  CARDIAC:  Review is negative for arrhythmia,  murmur, MI or pericarditis.  PULMONARY:  Positive for her symptoms noted  previously.  GI:  Review is negative for hepatitis, jaundice or blood  per rectum.  NEUROLOGIC:  Review is negative for stroke or seizure.  VASCULAR:  Review is negative  for DVT, claudication or TIA.  NEUROLOGIC:  Review is negative for diabetes, thyroid disease.  HEMATOLOGIC:  Review is negative for bleeding disorder.  She has had  some hidradenitis of the left groin crease over the years which is now  not active.  No history of depression or anxiety disorder.   PHYSICAL EXAMINATION:  GENERAL:  She is a middle-aged female in the  hospital room who appears to be feeling fairly well.  VITAL SIGNS:  Temperature 99.1, pulse 99, blood pressure 132/80,  saturation 95% on 2 liters.  She is alert and oriented.  HEENT:  Normocephalic.   NECK:  Without crepitus, JVD or mass.  She has no palpable adenopathy of  the  neck or supraclavicular fossa.  Breath sounds are diminished at the  left base, otherwise clear.  CARDIAC:  Exam is regular without S3, gallop or murmur.  ABDOMEN:  Soft, nontender.  EXTREMITIES:  Reveal no clubbing, cyanosis, edema.  Peripheral pulses  are intact.  NEUROLOGIC:  Exam is nonfocal.  I examined the left groin crease and she has evidence of some scarring  from hidradenitis which is not inflamed at this time.   LABORATORY DATA:  I reviewed the CT scans from last week and today and  there has been a significant and severe progression of disease.  She has  a large pleural effusion with compression of the lung.  The right  pleural space appears be clear.  White count today is 11.2, creatinine  0.6, BUN 4, glucose 105.   PLAN:  The patient will be prepared for VATS decortication on July 5.  This was discussed with the patient and family and all questions  addressed.      Kerin Perna, M.D.  Electronically Signed     PV/MEDQ  D:  07/29/2008  T:  07/29/2008  Job:  [id number]

## 2010-06-11 NOTE — H&P (Signed)
[name], [name]              ACCOUNT NO.:  [id number]   MEDICAL RECORD NO.:  [id number]          PATIENT TYPE:  INP   LOCATION:  A310                          FACILITY:  [facility]   PHYSICIAN:  Melissa L. Ladona Ridgel, MD  DATE OF BIRTH:  1960/09/15   DATE OF ADMISSION:  07/21/2008  DATE OF DISCHARGE:  LH                              HISTORY & PHYSICAL   CHIEF COMPLAINT:  Pain in my shoulder and shortness of breath.   HISTORY OF PRESENT ILLNESS:  The patient is a 50 year old white female  who started Tuesday with fever and pain across her shoulders.  She self  treated with Motrin and Advil.  She continued hurting.  She did not have  an excessive amount of cough, but did finally develop some.  Dr.  Nobie Putnam did an EKG, x-ray and sent some labs, gave her Vicodin and  doxycycline.  Today, she did not take her doxycycline secondary to  receiving a call that her x-ray definitely was positive for pneumonia  and she was asked to come to the office to pick up other medications.  Before she got to the office, she had explicit pain in her left  shoulder, became concerned so she came to the emergency room for care.  The pain was described as 3/10 in the left shoulder.   REVIEW OF SYSTEMS:  CONSTITUTIONAL:  She has had no weight loss or gain.  No fever no chills.  No blurred vision or double vision.  Chest pain,  left flank and left shoulder.  A light cough, nonproductive.  RESPIRATORY:  Positive shortness of breath.  GI:  Positive vomiting x2  and nausea, no diarrhea.  GU:  No hesitancy, frequency or dysuria.  She  had her last menstrual period starting on Wednesday, so she is currently  on her period.  NEUROLOGICAL:  No headaches or seizure disorder.  MUSCULOSKELETAL:  No deformities or pain.  INTEGUMENTARY:  No rashes.  HEMATOLOGICAL:  No bleeding disorders.  PSYCHIATRIC:  No depression or  anxiety.  ENDOCRINOLOGIC:  No diabetes or thyroid disorder.   PAST MEDICAL HISTORY:  None.   PAST  SURGICAL HISTORY:  None.   SOCIAL HISTORY:  She smokes two packs a day.   FAMILY HISTORY:  Mom is deceased with cancer.  Dad is living with a  history of Alzheimer's disease.  She is unemployed.  She has one child.   ALLERGIES:  NO KNOWN DRUG ALLERGIES.   CURRENT MEDICATIONS:  None.   PHYSICAL EXAMINATION:  VITAL SIGNS:  Temperature is 99.6, blood pressure  122/73, pulse 96, saturation 93%, respirations 18 and shallow.  GENERAL:  Moderate distress secondary to pain, shallow breathing.  HEENT:  She is normocephalic, atraumatic.  Pupils equal, round and  reactive to light.  Extraocular muscles intact.  NECK:  Supple.  There is no JVD, no lymph nodes, no carotid bruits.  CHEST:  Coarse crackles right base with decreased breath sounds in the  left lung and dullness at the left base.  She also has a little dullness  at the right base.  No wheezing is present.  CARDIOVASCULAR:  Regular  rate and rhythm.  Positive S1-S2.  No S3-S4.  No murmurs, rubs or  gallop.  ABDOMEN:  Soft, nontender, nondistended with positive bowel sounds.  EXTREMITIES:  Show 2+ pulses with no clubbing, cyanosis or edema.  NEUROLOGICALLY:  She is awake, alert and oriented.  Cranial nerves II-  XII are intact.  Power is 5/5.  DTRs are 2+.  Plantars are downgoing.  PSYCHIATRICALLY:  Affect is appropriate.  Recent and remote memory are  intact.  Judgment and insight are intact.   PERTINENT LABORATORIES:  White count 15.7, hemoglobin 15.1, hematocrit  43.1 and platelets of 339.  Sodium 137, potassium 3.7, chloride 103, CO2  of 27, BUN 7, creatinine is 0.68.  calcium is 8.8.  Chest x-ray shows  bilateral infiltrates at the bases with small effusion at the left base.  CT scan is negative for PE.  She has bibasilar atelectasis and there is  a consolidative process in the posterior basal aspect of the both lower  lobes  There are no masses or lymphadenopathy.   ASSESSMENT/PLAN:  This is a 50 year old white female with  heavy tobacco  abuse who presents with chest pain, shortness of breath, found to have  bilateral pneumonia and small left pleural effusion.   1. Pneumonia.  Ceftriaxone and erythromycin will be continued.  Unable      to mobilize secretions, dry cough.  2. Tobacco abuse.  We will do a Nicoderm patch and tobacco cessation      counseling.  3. Nausea secondary to #1.  We will treat her symptomatically.  4. Deep venous thrombosis prophylaxis will be with heparin/Lovenox.   Total time on this case is 4:30 to 5:36 p.m.      Melissa L. Ladona Ridgel, MD  Electronically Signed     MLT/MEDQ  D:  07/22/2008  T:  07/22/2008  Job:  [id number]   cc:   Patrica Duel, M.D.  Fax: [phone]

## 2010-06-11 NOTE — Group Therapy Note (Signed)
[name], [name]              ACCOUNT NO.:  [id number]   MEDICAL RECORD NO.:  [id number]          PATIENT TYPE:  INP   LOCATION:  A310                          FACILITY:  [facility]   PHYSICIAN:  Melissa L. Ladona Ridgel, MD  DATE OF BIRTH:  Jan 18, 1961   DATE OF PROCEDURE:  07/23/2008  DATE OF DISCHARGE:                                 PROGRESS NOTE   [name] was admitted with bilateral lower lobe pneumonia.  She continues  to improve steadily but slowly with less pleuritic chest pain today.  She still has significant hypoxia off of O2.  She has decreased breath  sounds bilaterally but has been able to assist with her activities of  daily living.  She is slightly tachycardic with exertion still, but  overall feels she is improving.  She also has tolerated an advancement  in her diet.  Today, her T-max was 100.8, this morning she was 97.0,  blood pressures have been 125/72 with heart rates of 88, but when she is  exerting herself she can go as high as 130.  Saturations are 92% on 3 L  and off O2 she can drop as low as 77.   PHYSICAL EXAMINATION:  GENERAL:  This is a moderately obese white  female, who is in less distress than she has been but still somewhat  tachypneic at rest.  HEENT:  She is normocephalic, atraumatic.  Pupils equal, round, and  reactive to light.  Extraocular muscles are intact.  She has anicteric  sclerae.  Examination of the nose reveals septum midline without  discharge.  Examination of the mouth reveals no oral lesions or lip  lesions.  NECK:  Supple, there is no JVD, no lymph nodes, no carotid bruits.  CHEST:  Very decreased with poor air entry, but no wheezing is present,  and I do not appreciate the crackles that I heard earlier in the week.  She is definitely dull to percussion at the bases.  CARDIOVASCULAR:  Regular rate/rhythm unless she is exerting herself at  which time she is tachycardic, positive S1 S2,  S3 S4, no murmurs, rubs,  or gallops.  ABDOMEN:  Obese,  nontender, and nondistended.  There are positive bowel  sounds.  There is no hepatosplenomegaly, and no guarding, no rebound.  EXTREMITIES:  No clubbing, cyanosis, or edema.  NEUROLOGIC:  She is awake, alert, oriented.  Cranial nerves II-XII are  intact.  Power is 5/5.  DTRs are 2+.  Plantars are downgoing.  PSYCHIATRIC:  Affect is appropriate.  She is much brighter, feeling much  better today.   LABORATORY DATA:  Respiratory culture is being reincubated for better  growth.  Her sodium is 139, potassium 3.9, chloride 108, CO2 is 25, BUN  is 5, creatinine 0.55, and glucose is 117.  CBC reveals a white count of  14, hemoglobin of 12.6, hematocrit 36, platelets of 314.  Her TSH is  1.281.   ASSESSMENT AND PLAN:  This is a 50 year old female, tobacco abuser, who  presents with bilateral pneumonia with an infusion on the left, who is  slowly but steadily improving.  She has less pleuritic chest pain, is  able to take a deeper breath, but she is still not oxygenating as well  as I would like her to be, but we will continue to treat her.  She is  day 2 of ceftriaxone and azithromycin.  We will continue aggressive  pulmonary toilet.  1. Pneumonia.  Aggressive pulmonary toilet, antibiotics.  She does not      need any steroids at this time.  We will use a flutter valve to      continue to mobilize her secretions.  2. Tobacco abuse.  I will continue the Nicoderm and tobacco cessation      counseling will be delivered.  3. Nausea is improved.  4. Pleuritic chest pain is improved.  Continue nonsteroidal      antiinflammatory drugs (NSAIDs) and medications to assist her in      taking deep breaths.  5. Deep venous thrombosis prophylaxis.  We will continue her on      Lovenox.   Total time on this case was 25 minutes      Melissa L. Ladona Ridgel, MD  Electronically Signed     MLT/MEDQ  D:  07/23/2008  T:  07/23/2008  Job:  [id number]

## 2010-06-11 NOTE — Assessment & Plan Note (Signed)
OFFICE VISIT   LOYALTY, ARENTZ  DOB:  12/21/1960                                        August 31, 2008  CHART #:  [id number]   CURRENT PROBLEMS:  1. Status post left VATS decortication and drainage of empyema,      07/31/2008.  2. Reformed smoker.   PRESENT ILLNESS:  The patient is a 50 year old Caucasian ex-smoker who  returns for a final office visit after undergoing left VATS  decortication of a large loculated empyema.  She has done well at home  and has finished her course of oral antibiotics (Avelox).  She is not  taking any pain medication, has been gaining weight and strength.  She  is breathing well.  Her operative cultures were negative for identifying  an organism.  She has not been smoking.   PHYSICAL EXAMINATION:  She is afebrile.  Blood pressure is 140/80, pulse  65, respirations 18, saturation 97% on room air finding on exam she is  alert and appropriate and her breath sounds are clear and equal.  She  has a well-healed left VATS incision and the chest tube sites are  healing.  Cardiac, rhythm is regular and there is no peripheral edema.   PA and lateral chest x-ray shows clear lung fields and no evidence of  residual pleural effusion or thickening.  No infiltrate.   PLAN:  The patient can resume normal activities including driving and  household activities.  She should avoid heavy lifting or strenuous yard  work for another month.  She will return as needed.  No prescriptions  were provided in this office visit.   Kerin Perna, M.D.  Electronically Signed   PV/MEDQ  D:  08/31/2008  T:  09/01/2008  Job:  [id number]   cc:   Patrica Duel, M.D.

## 2010-06-11 NOTE — Consult Note (Signed)
[name], [name]              ACCOUNT NO.:  [id number]   MEDICAL RECORD NO.:  [id number]          PATIENT TYPE:  INP   LOCATION:  A310                          FACILITY:  [facility]   PHYSICIAN:  Edward L. Juanetta Gosling, M.D.DATE OF BIRTH:  1960/04/12   DATE OF CONSULTATION:  07/25/2008  DATE OF DISCHARGE:                                 CONSULTATION   The patient of the Hospitalist Team.   REASON FOR CONSULTATION:  Pneumonia with pleural effusion.   HISTORY:  [name] is a 50 year old who came to the hospital because  of pneumonia.  She has right-sided pneumonia, a left-sided pleural  effusion, and her left-sided pleural effusion has gotten larger by x-  ray.  She says that she was in her usual state of fair health when she  developed shortness of breath, cough, congestion, went to her primary  care physician, who had her get some laboratory work and x-ray done.  This did show pneumonia and she was told to come to their office and get  other medications, but she developed increasing pain when came to the  emergency room.  When she came to the emergency room, she was found to  have what appeared to be a pneumonia on the right with pleural effusion  on the left and some atelectasis versus pneumonia on the left.   PAST MEDICAL HISTORY:  Positive for [facility] spotted fever a year  ago.  She has not had any surgery.   SOCIAL HISTORY:  She is divorced, smokes about 2 packs a day, but says  that she is going to stop now.  She does not use any alcohol.   FAMILY HISTORY:  Her mother died with cancer.  I am not certain of the  source.  Her father has Alzheimer disease.   She is on no regular home medications.   REVIEW OF SYSTEMS:  Except as mentioned is negative.   PHYSICAL EXAMINATION:  GENERAL:  A well-developed, well-nourished  female, who does not appear to be in any acute distress.  VITAL SIGNS:  Her respirations are about 18 and unlabored.  Blood  pressure 145/90,  temperature is 98.8, and pulse 100.  Her O2 sats 93% on  3 L.  NECK:  Supple without masses.  CHEST:  Pretty clear without any wheezing now.  She has rhonchi  bilaterally.  HEART:  Regular.  ABDOMEN:  Soft.  EXTREMITIES:  No edema.   LABORATORY WORK:  Now, BNP is 88.7.  BMET shows BUN of 3, creatinine  0.65.  CBC; white count 12,100, hemoglobin 12.1, and platelets 399.  She  did have a CT angio done that did not show pulmonary emboli.  On that  test, which was read on the July 21, 2008, there was said to be a tiny  left pleural effusion.  There are now seems to be more effusion on chest  x-ray on July 24, 2008.   ASSESSMENT:  She has bilateral pneumonia.  She is on vancomycin,  Zithromax, and Rocephin.  She has a pleural effusion.  She is going to  need to have a  thoracentesis and I have requested that to be done using  ultrasonic guidance by Radiology.      Edward L. Juanetta Gosling, M.D.  Electronically Signed     ELH/MEDQ  D:  07/25/2008  T:  07/25/2008  Job:  [id number]

## 2010-06-11 NOTE — Discharge Summary (Signed)
NAMELAVORA, [name] NO.:  [id number]   MEDICAL RECORD NO.:  [id number]          PATIENT TYPE:  INP   LOCATION:  2010                         FACILITY:  [facility]   PHYSICIAN:  Renee Ramus, MD       DATE OF BIRTH:  [birth year]   DATE OF ADMISSION:  07/29/2008  DATE OF DISCHARGE:  LH                               DISCHARGE SUMMARY   PRIMARY DISCHARGE DIAGNOSIS:  Loculated pleural effusion with likely  empyema.   SECONDARY DIAGNOSES:  1. Pneumonia.  2. Tobacco abuse.   HOSPITAL COURSE:  1. Empyema with pneumonia.  The patient is a 50 year old female who      was admitted secondary to pain across her shoulders.  The patient      also had a dry cough.  The patient presented to the emergency      department and there she was found to have an advanced bilateral      pneumonia.  She was admitted to our service.  She was followed by      Pulmonology.  She did have evidence of empyema and despite      treatment with broad-spectrum antibiotics, her chest x-ray has not      appreciably improved.  The patient does have an extensive tobacco      history and has smoked 2 packs per day for several years.  She was      clinically stable, but her white count persists, and her pain with      deep inspiration persists.  After discussing with Cardiothoracic      Surgery, we have decided to transfer the patient to [facility] for      further evaluation and treatment by their service with regards to      her empyema.  2. Tobacco abuse.  The patient is now wishing to quit.  She is on      nicotine patch and this seems to be helping and she should follow      up with her primary care physician with regards to further      treatment.   LABORATORY DATA:  1. Leukocytosis.  White count of 15.7, decreasing to 9.8, and      increasing to 11.2.  2. Mild anemia with hemoglobin of 12.3, hematocrit of 35.6.  3. Thrombocytosis with platelet count of 718, most likely representing  inflammation.  4. Mild elevation in blood glucose at 110.   STUDIES:  1. Initial chest x-ray showing bilateral lower lobe opacities with      likely pneumonia.  2. CT pulmonary angiogram of the chest showing bilateral lower lobe      consolidation of pneumonia and atelectasis without evidence of      pulmonary embolus.  3. Followup chest x-ray showing increased right lung infiltrate with      significant increase in left pleural effusion with basilar      atelectasis.  4. Ultrasound of the chest and attempted aspiration showing small left      pleural effusion, but the diagnostic thoracentesis was not      successful.  5. Final chest x-ray showing moderate left pleural effusion likely      loculated with interval improvement of bilateral lower lung      airspace disease.   MEDICATIONS ON DISCHARGE:  1. Augmentin 875 mg p.o. q.12.  2. Zithromax 250 mg p.o. daily.  3. Enoxaparin 40 mg subcu q.24 h.  4. Mucinex 600 mg p.o. q.12.  5. Atrovent 0.5 mg inhaled q.6 h.  6. Xopenex 0.63 mg inhaled q.6 h.  7. Nicotine patch 21 mg apply daily.  8. Vancomycin 1 g IV q.24 h.   There are no labs or studies pending at time of transfer.  The patient  is in stable condition and anxious for transfer.   TIME SPENT:  35 minutes.      Renee Ramus, MD  Electronically Signed     JF/MEDQ  D:  07/29/2008  T:  07/29/2008  Job:  [id number]   cc:   Patrica Duel, M.D.  Fax: [phone]

## 2010-06-11 NOTE — Group Therapy Note (Signed)
[name], [name]              ACCOUNT NO.:  [id number]   MEDICAL RECORD NO.:  [id number]          PATIENT TYPE:  INP   LOCATION:  A310                          FACILITY:  [facility]   PHYSICIAN:  Melissa L. Ladona Ridgel, MD  DATE OF BIRTH:  04/14/1960   DATE OF PROCEDURE:  07/26/2008  DATE OF DISCHARGE:                                 PROGRESS NOTE   Please see the accompanying written portion of the progress note.   OBJECTIVE:  The patient was seen and examined sitting up in her chair by  the window. She continues to appear improved but ill.  She states she  did sleep a little better last night, but persists in having shortness  of breath with exertion and some pain on the left.  Her appetite  continues to improve but she does remain debilitated from her baseline.   She denies nausea, vomiting, abdominal pain, diarrhea.   VITAL SIGNS:  Temperature 99.1, blood pressure 140/74, pulse ranges from  103-110, respirations 22-23, saturations 91%-97%.  Intake and output:  1365 in, 10 voids and 1 stool documented.  GENERAL:  This is a moderately obese white female in no acute distress.  She is normocephalic, atraumatic.  Pupils are equal, round, reactive to  light.  Extraocular muscles are intact.  Membranes are moist.  NECK:  Supple.  There is no JVD.  No lymph nodes.  No carotid bruits.  CHEST:  Reveals coarse crackles in the left base, with improved air  entry.  No wheezing on that side.  She is definitively dull at the base.  Examination of the right lung reveals decreased air entry with dullness  at the base but no wheezes.  CARDIOVASCULAR:  Tachycardia, positive S1 and S2.  No S3 or S4.  No  murmurs, rubs or gallops.  ABDOMEN:  Soft, nontender, nondistended with positive bowel sounds.  There is no hepatosplenomegaly, no guarding or rebound.  NEUROLOGIC:  She is awake, alert, oriented.  Cranial nerves II-XII are  intact.  Power is 5/5.  DTRs 2+.  Plantars are downgoing.   PERTINENT  LABORATORY VALUES:  Sodium was 137, potassium 3.2, chloride  23, BUN 4, creatinine 0.65.   ASSESSMENT/PLAN:  The patient is a 50 year old white female who has a 2-  pack a day tobacco history for many years, presented with left sided  chest pain, found to have bilateral basilar pneumonia, left greater than  right.  She was initially started on community-acquired coverage with  ceftriaxone and azithromycin but she continued to have low grade  temperatures and persisted to have dense infiltrates on chest x-ray.  Therefore vancomycin was added.  The patient is approximately day 3 of  vancomycin therapy.  I have asked Dr. Juanetta Gosling to follow along and assist  [place] in her care.  The patient was sent for ultrasound guided  thoracentesis but she did not have enough fluid to tap.  Therefore, the  recommendation, since she clinically is improved, is to continue her  current therapy.  1. Bilateral pneumonia, currently on azithromycin and vancomycin.      Will continue with pulmonary  toilet.  The patient may need home O2      at the time of discharge.  I would suspect until she is completely      afebrile that she should remain in the hospital.  2. Intermittent hypokalemia.  Should be repleted as needed.      Clinically no evidence for arrhythmia and she has reasonable renal      function.  3. Tobacco abuse.  The patient has been provided with Nicoderm and      tobacco cessation counseling.  4. Tachycardia and cardiomegaly.  An echo was completed which reveals      left ventricular cavity was normal size, systolic function was      vigorous with an EF of 65%-70%.  There is no wall motion      abnormality.   Total time in this case was 20 minutes.   DISPOSITION:  I suspect she will  need several more days of hospital  care.      Melissa L. Ladona Ridgel, MD  Electronically Signed     MLT/MEDQ  D:  07/27/2008  T:  07/27/2008  Job:  [id number]

## 2010-06-11 NOTE — Discharge Summary (Signed)
[name], [name] NO.:  [id number]   MEDICAL RECORD NO.:  [id number]          PATIENT TYPE:  INP   LOCATION:  6734                         FACILITY:  [facility]   PHYSICIAN:  Elliot Cousin, M.D.    DATE OF BIRTH:  1960/07/23   DATE OF ADMISSION:  04/28/2007  DATE OF DISCHARGE:  04/30/2007                               DISCHARGE SUMMARY   DISCHARGE DIAGNOSIS:  Sign-symptom complex with fever, rash, and  myalgias.   DISCHARGE MEDICATIONS:  Advil 200 mg 1-2 tablets every 4 hours as needed  or as directed.   CONSULTATIONS:  Cliffton Asters, M.D.   HISTORY OF PRESENT ILLNESS:  The patient is a 50 year old woman with no  significant past medical history, who presented to the emergency  department on April 28, 2007, with a chief complaint of fever, rash, and  ankle swelling.  The patient had been treated with doxycycline  empirically by her primary care physician.  She took approximately 3  doses of the doxycycline.  The rash apparently became worse.  She  presented to the emergency department on April 26, 2007, and was given  Zithromax.  There was some question of bibasilar pneumonia on the chest  x-ray; however, this was felt to be secondary to atelectasis.  The  patient's rash and myalgias did not improve, and therefore, she  presented again to the emergency department on April 28, 2007.   For additional details, please see the dictated history and physical.   HOSPITAL COURSE:  FEVER, RASH, AND MYALGIAS.  The patient was completely  afebrile and hemodynamically stable at the time of the initial hospital  assessment.  Her white blood cell count was within normal limits at 8.4.  For further evaluation, a number of studies were ordered.  Her sed rate  was marginally elevated at 24.  Her urinalysis revealed small  leukocytes, however, the patient had no complaints of painful urination.  Her rheumatoid factor was less than 20.  Her TSH was 2.0.  ANA, acute  viral  hepatitis panel, CMV antibody, [facility] spotted fever  antibody, and Lyme disease antibody  are all pending.  On April 25, 2007, the patient's rapid strep screen was negative and the influenza A  and B virus antigen were negative.   During the hospital course, the patient was treated symptomatically.  She was not restarted on antibiotics.  For further evaluation,  Infectious disease physician, Dr. Orvan Falconer was consulted.  Dr. Orvan Falconer  agreed with not restarting the patient on antibiotics.  Per his  assessment, we may never know the cause of the patient's initial insult;  however, because her symptoms are resolving, he recommended discharging  the patient to home with close followup by her primary care physician.  Although we are unsure of the initial insult, the reaction may have been  worsened by doxycycline.  No further testing or treatments were  recommended by Dr. Orvan Falconer.  Today, the patient  has no fever, less joint pain, and a resolving rash.  The patient was  advised to call the dictating physician in 3-4 days for an update  and/or  for results of the lab studies currently pending.  The patient was  advised to wear protective clothing when she is outside.      Elliot Cousin, M.D.  Electronically Signed     DF/MEDQ  D:  04/30/2007  T:  05/01/2007  Job:  [id number]

## 2010-10-21 LAB — INFLUENZA A+B VIRUS AG-DIRECT(RAPID): Influenza B Ag: NEGATIVE

## 2010-10-21 LAB — DIFFERENTIAL
Eosinophils Relative: 0
Lymphocytes Relative: 22
Lymphs Abs: 1.7
Monocytes Absolute: 0.2
Monocytes Relative: 2 — ABNORMAL LOW

## 2010-10-21 LAB — SEDIMENTATION RATE: Sed Rate: 20

## 2010-10-21 LAB — URINALYSIS, ROUTINE W REFLEX MICROSCOPIC
Bilirubin Urine: NEGATIVE
Glucose, UA: NEGATIVE
Ketones, ur: NEGATIVE
Protein, ur: NEGATIVE
pH: 6

## 2010-10-21 LAB — CBC
HCT: 41.8
Hemoglobin: 14.4
WBC: 7.6

## 2010-10-21 LAB — POCT I-STAT, CHEM 8
BUN: 13
Calcium, Ion: 1.13
Creatinine, Ser: 1.1
Glucose, Bld: 104 — ABNORMAL HIGH
TCO2: 23

## 2010-10-21 LAB — HEPATIC FUNCTION PANEL
ALT: 45 — ABNORMAL HIGH
AST: 38 — ABNORMAL HIGH
Bilirubin, Direct: 0.1

## 2010-10-22 LAB — LIPID PANEL
Cholesterol: 125
HDL: 18 — ABNORMAL LOW
LDL Cholesterol: 61
Total CHOL/HDL Ratio: 6.9

## 2010-10-22 LAB — COMPREHENSIVE METABOLIC PANEL
ALT: 42 — ABNORMAL HIGH
AST: 31
AST: 38 — ABNORMAL HIGH
CO2: 24
Calcium: 8.2 — ABNORMAL LOW
Calcium: 8.6
Creatinine, Ser: 0.76
GFR calc Af Amer: >60
GFR calc Af Amer: >60
GFR calc non Af Amer: >60
Glucose, Bld: 111 — ABNORMAL HIGH
Sodium: 137
Total Protein: 5.8 — ABNORMAL LOW

## 2010-10-22 LAB — ROCKY MTN SPOTTED FVR AB, IGM-BLOOD: RMSF IgM: 1.35 IV

## 2010-10-22 LAB — DIFFERENTIAL
Lymphocytes Relative: 27
Lymphs Abs: 2.3
Neutro Abs: 5.5
Neutrophils Relative %: 65

## 2010-10-22 LAB — CBC
MCHC: 34.3
MCV: 91.2
RBC: 4.45

## 2010-10-22 LAB — HEPATITIS PANEL, ACUTE
HCV Ab: NEGATIVE
Hep A IgM: NEGATIVE
Hepatitis B Surface Ag: NEGATIVE

## 2010-10-22 LAB — URINALYSIS, ROUTINE W REFLEX MICROSCOPIC
Ketones, ur: 15 — AB
Nitrite: NEGATIVE
Protein, ur: NEGATIVE

## 2010-10-22 LAB — ROCKY MTN SPOTTED FVR AB, IGG-BLOOD: RMSF IgG: <1:64 {titer}

## 2010-10-22 LAB — SEDIMENTATION RATE: Sed Rate: 24 — ABNORMAL HIGH

## 2010-10-22 LAB — URINE CULTURE
Colony Count: NO GROWTH
Culture: NO GROWTH

## 2010-10-22 LAB — CMV ABS, IGG+IGM (CYTOMEGALOVIRUS): Cytomegalovirus Ab-IgG: 3.04 Index — ABNORMAL HIGH (ref <0.91)

## 2010-10-22 LAB — MAGNESIUM: Magnesium: 2

## 2016-01-18 ENCOUNTER — Emergency Department (HOSPITAL_COMMUNITY)
Admission: EM | Admit: 2016-01-18 | Discharge: 2016-01-18 | Disposition: A | Discharge status: Home / Self Care | Payer: Self-pay | Attending: Emergency Medicine | Admitting: Emergency Medicine

## 2016-01-18 ENCOUNTER — Encounter (HOSPITAL_COMMUNITY): Payer: Self-pay

## 2016-01-18 DIAGNOSIS — Z791 Long term (current) use of non-steroidal anti-inflammatories (NSAID): Secondary | ICD-10-CM | POA: Insufficient documentation

## 2016-01-18 DIAGNOSIS — I10 Essential (primary) hypertension: Secondary | ICD-10-CM | POA: Insufficient documentation

## 2016-01-18 DIAGNOSIS — R040 Epistaxis: Secondary | ICD-10-CM | POA: Insufficient documentation

## 2016-01-18 DIAGNOSIS — F17200 Nicotine dependence, unspecified, uncomplicated: Secondary | ICD-10-CM | POA: Insufficient documentation

## 2016-01-18 MED ORDER — OXYMETAZOLINE HCL 0.05 % NA SOLN
2.0000 | Freq: Once | NASAL | Status: AC
  Administered 2016-01-18: 2 via NASAL
  Filled 2016-01-18: qty 15

## 2016-01-18 NOTE — ED Triage Notes (Signed)
Pt states she awoke with a headache, states she then had a nosebleed this am.  Pt states she checked her blood pressure this afternoon and it was 180/100.  Pt states she had another nosebleed this afternoon, took some ibuprofen for her headache.

## 2016-01-18 NOTE — Discharge Instructions (Signed)
If bleeding recurs, hold pressure as discussed. Return to the ED if you develop new or worsening symptoms. Follow up with your doctor regarding your elevated blood pressure.

## 2016-01-18 NOTE — ED Notes (Signed)
Pt alert & oriented x4, stable gait. Patient given discharge instructions, paperwork & prescription(s). Patient  instructed to stop at the registration desk to finish any additional paperwork. Patient verbalized understanding. Pt left department w/ no further questions. 

## 2016-01-18 NOTE — ED Provider Notes (Signed)
[facility]EMERGENCY DEPT Provider Note   CSN: [id number] Arrival date & time: 01/18/16  0009     History   Chief Complaint Chief Complaint  Patient presents with  . Epistaxis    HPI [name] is a 55 y.o. female.  Patient presents with intermittent nosebleed since this morning. States his bilateral. Has been holding pressure with partial relief. Blood pressure elevated with no history of same. Nosebleed recurred around this afternoon. She's been using ibuprofen intermittently for headache. Denies any significant headache at this time. She's been spitting up some blood no chest pain or shortness of breath.   The history is provided by the patient.  Epistaxis      History reviewed. No pertinent past medical history.  There are no active problems to display for this patient.   History reviewed. No pertinent surgical history.  OB History    No data available       Home Medications    Prior to Admission medications   Medication Sig Start Date End Date Taking? Authorizing Provider  ibuprofen (ADVIL,MOTRIN) 200 MG tablet Take 200 mg by mouth every 6 (six) hours as needed.   Yes Historical Provider, MD    Family History No family history on file.  Social History Social History  Substance Use Topics  . Smoking status: Current Every Day Smoker  . Smokeless tobacco: Never Used  . Alcohol use Yes     Allergies   Patient has no known allergies.   Review of Systems Review of Systems  Constitutional: Negative for activity change and appetite change.  HENT: Positive for congestion, nosebleeds and rhinorrhea. Negative for sinus pressure and trouble swallowing.   Respiratory: Negative for cough, chest tightness and shortness of breath.   Cardiovascular: Negative for chest pain.  Gastrointestinal: Negative for abdominal pain, nausea and vomiting.  Genitourinary: Negative for dysuria and hematuria.  Musculoskeletal: Negative for arthralgias, back pain and  myalgias.  Neurological: Negative for dizziness, weakness and headaches.   A complete 10 system review of systems was obtained and all systems are negative except as noted in the HPI and PMH.    Physical Exam Updated Vital Signs BP 162/85 (BP Location: Left Arm)   Pulse 85   Temp 98 F (36.7 C) (Oral)   Resp 16   Ht 5\' 3"  (1.6 m)   Wt 165 lb (74.8 kg)   SpO2 98%   BMI 29.23 kg/m   Physical Exam  Constitutional: She is oriented to person, place, and time. She appears well-developed and well-nourished. No distress.  HENT:  Head: Normocephalic and atraumatic.  Mouth/Throat: Oropharynx is clear and moist. No oropharyngeal exudate.  Dried blood to nares bilaterally, no active bleeding, oropharynx clear  Eyes: Conjunctivae and EOM are normal. Pupils are equal, round, and reactive to light.  Neck: Normal range of motion. Neck supple.  No meningismus.  Cardiovascular: Normal rate, regular rhythm, normal heart sounds and intact distal pulses.   No murmur heard. Pulmonary/Chest: Effort normal and breath sounds normal. No respiratory distress.  Abdominal: Soft. There is no tenderness. There is no rebound and no guarding.  Musculoskeletal: Normal range of motion. She exhibits no edema or tenderness.  Neurological: She is alert and oriented to person, place, and time. No cranial nerve deficit. She exhibits normal muscle tone. Coordination normal.  No ataxia on finger to nose bilaterally. No pronator drift. 5/5 strength throughout. CN 2-12 intact.Equal grip strength. Sensation intact.   Skin: Skin is warm.  Psychiatric: She has a  normal mood and affect. Her behavior is normal.  Nursing note and vitals reviewed.    ED Treatments / Results  Labs (all labs ordered are listed, but only abnormal results are displayed) Labs Reviewed - No data to display  EKG  EKG Interpretation None       Radiology No results found.  Procedures .Epistaxis Management Date/Time: 01/18/2016 5:19  AM Performed by: Glynn OctaveANCOUR, Pressley Tadesse Authorized by: Glynn OctaveANCOUR, Jaquelynn Wanamaker   Consent:    Consent obtained:  Verbal   Consent given by:  Patient Procedure details:    Repair method: afrin.   Treatment complexity:  Limited   Treatment episode: recurring   Post-procedure details:    Assessment:  Bleeding stopped   Patient tolerance of procedure:  Tolerated well, no immediate complications   (including critical care time)  Medications Ordered in ED Medications  oxymetazoline (AFRIN) 0.05 % nasal spray 2 spray (2 sprays Each Nare Given 01/18/16 0314)     Initial Impression / Assessment and Plan / ED Course  I have reviewed the triage vital signs and the nursing notes.  Pertinent labs & imaging results that were available during my care of the patient were reviewed by me and considered in my medical decision making (see chart for details).  Clinical Course    Intermittent nosebleed for the past day. Patient hypertensive with no history of same.  No dizziness or lightheadedness. After clots are expressed, patient has no area of visible bleeding. There is no blood in posterior pharynx. Topical afrin given.  Patient monitored in the ED without recurrence of bleeding. Discussed her elevated blood pressure and need for follow-up with PCP. Follow-up with ENT and PCP.  Return Precautions discussed.  Final Clinical Impressions(s) / ED Diagnoses   Final diagnoses:  Epistaxis  Essential hypertension    New Prescriptions New Prescriptions   No medications on file     Glynn OctaveStephen Nusaybah Ivie, MD 01/18/16 [phone]

## 2018-01-15 ENCOUNTER — Emergency Department (HOSPITAL_BASED_OUTPATIENT_CLINIC_OR_DEPARTMENT_OTHER): Payer: BLUE CROSS/BLUE SHIELD

## 2018-01-15 ENCOUNTER — Encounter (HOSPITAL_BASED_OUTPATIENT_CLINIC_OR_DEPARTMENT_OTHER): Payer: Self-pay

## 2018-01-15 ENCOUNTER — Other Ambulatory Visit: Payer: Self-pay

## 2018-01-15 ENCOUNTER — Emergency Department (HOSPITAL_BASED_OUTPATIENT_CLINIC_OR_DEPARTMENT_OTHER)
Admission: EM | Admit: 2018-01-15 | Discharge: 2018-01-15 | Disposition: A | Discharge status: Home / Self Care | Payer: BLUE CROSS/BLUE SHIELD | Attending: Emergency Medicine | Admitting: Emergency Medicine

## 2018-01-15 DIAGNOSIS — R05 Cough: Secondary | ICD-10-CM | POA: Diagnosis present

## 2018-01-15 DIAGNOSIS — J069 Acute upper respiratory infection, unspecified: Secondary | ICD-10-CM | POA: Diagnosis not present

## 2018-01-15 DIAGNOSIS — F17210 Nicotine dependence, cigarettes, uncomplicated: Secondary | ICD-10-CM | POA: Insufficient documentation

## 2018-01-15 DIAGNOSIS — B9789 Other viral agents as the cause of diseases classified elsewhere: Secondary | ICD-10-CM | POA: Diagnosis not present

## 2018-01-15 MED ORDER — BENZONATATE 100 MG PO CAPS: 100.0000 mg | ORAL_CAPSULE | Freq: Three times a day (TID) | ORAL | 0 refills | Status: DC

## 2018-01-15 MED FILL — BENZONATATE 100 MG CAP: 100 | 7 days supply | Qty: 21 | Fill #0

## 2018-01-15 NOTE — ED Triage Notes (Signed)
C/o flu like sx x 5 days-NAD-steady gait 

## 2018-01-15 NOTE — Discharge Instructions (Signed)
Your symptoms are likely caused by a viral upper respiratory infection. Antibiotics are not helpful in treating viral infection, the virus should run its course in about 5-7 days. Please make sure you are drinking plenty of fluids. You can treat your symptoms supportively with tylenol/ibuprofen for fevers and pains, Zyrtec and Flonase to help with nasal congestion, and prescribed cough medicine and throat lozenges to help with cough. If your symptoms are not improving please follow up with you Primary doctor.  ° °If you develop persistent fevers, shortness of breath or difficulty breathing, chest pain, severe headache and neck pain, persistent nausea and vomiting or other new or concerning symptoms return to the Emergency department. ° °

## 2018-01-15 NOTE — ED Provider Notes (Signed)
[facility] EMERGENCY DEPARTMENT Provider Note   CSN: [id number] Arrival date & time: 01/15/18  1139     History   Chief Complaint Chief Complaint  Patient presents with  . Cough    HPI [name] is a 57 y.o. female.  [name] is a 57 y.o. female who is otherwise healthy, presents to the emergency department for evaluation of 5 days of cough, congestion, rhinorrhea, sore throat and body aches.  Since onset symptoms have been constant and worsening.  She had some chills and fevers earlier in the week but this seems to have resolved.  She denies any associated chest pain or shortness of breath has not had any nausea, vomiting or abdominal pain.  She has been taking Tylenol for her symptoms with minimal improvement but has not tried anything else to treat her symptoms, and denies any other aggravating or alleviating factors.  Unsure of any sick contacts.  Patient has been out of work for the past 3 days.     History reviewed. No pertinent past medical history.  There are no active problems to display for this patient.   History reviewed. No pertinent surgical history.   OB History   No obstetric history on file.      Home Medications    Prior to Admission medications   Medication Sig Start Date End Date Taking? Authorizing Provider  benzonatate (TESSALON) 100 MG capsule Take 1 capsule (100 mg total) by mouth every 8 (eight) hours. 01/15/18   Dartha LodgeFord, Oliviana Mcgahee N, PA-C  ibuprofen (ADVIL,MOTRIN) 200 MG tablet Take 200 mg by mouth every 6 (six) hours as needed.    [provider]    Family History No family history on file.  Social History Social History   Tobacco Use  . Smoking status: Current Every Day Smoker    Types: Cigarettes  . Smokeless tobacco: Never Used  Substance Use Topics  . Alcohol use: Not Currently    Frequency: Never  . Drug use: Never     Allergies   Other   Review of Systems Review of Systems    Constitutional: Positive for chills and fever.  HENT: Positive for congestion, postnasal drip, rhinorrhea, sinus pressure and sore throat.   Respiratory: Positive for cough. Negative for chest tightness, shortness of breath and wheezing.   Cardiovascular: Negative for chest pain.  Gastrointestinal: Negative for abdominal pain, nausea and vomiting.  Genitourinary: Negative for dysuria.  Musculoskeletal: Positive for myalgias. Negative for arthralgias.  Skin: Negative for color change and rash.  Neurological: Negative for syncope, light-headedness and headaches.  All other systems reviewed and are negative.    Physical Exam Updated Vital Signs BP (!) 168/66 (BP Location: Left Arm)   Pulse 86   Temp 98.2 F (36.8 C) (Oral)   Resp 18   Ht 5\' 3"  (1.6 m)   Wt 69.4 kg   SpO2 95%   BMI 27.10 kg/m   Physical Exam Vitals signs and nursing note reviewed.  Constitutional:      General: She is not in acute distress.    Appearance: Normal appearance. She is well-developed. She is not ill-appearing or diaphoretic.  HENT:     Head: Normocephalic and atraumatic.     Right Ear: Tympanic membrane normal.     Left Ear: Tympanic membrane normal.     Nose: Congestion and rhinorrhea present.     Mouth/Throat:     Mouth: Mucous membranes are moist.     Pharynx: Oropharynx  is clear. Posterior oropharyngeal erythema present. No oropharyngeal exudate.     Comments: Pposterior oropharynx clear and moist, with some erythema, no edema or exudates, uvula midline  Eyes:     General:        Right eye: No discharge.        Left eye: No discharge.  Neck:     Musculoskeletal: Neck supple.     Comments: No rigidity Cardiovascular:     Rate and Rhythm: Normal rate and regular rhythm.     Heart sounds: Normal heart sounds.  Pulmonary:     Effort: Pulmonary effort is normal. No respiratory distress.     Breath sounds: Normal breath sounds.     Comments: Respirations equal and unlabored, patient able  to speak in full sentences, lungs clear to auscultation bilaterally Abdominal:     General: Bowel sounds are normal. There is no distension.     Palpations: Abdomen is soft. There is no mass.     Tenderness: There is no abdominal tenderness. There is no guarding.     Comments: Abdomen soft, nondistended, nontender to palpation in all quadrants without guarding or peritoneal signs  Musculoskeletal:        General: No deformity.  Lymphadenopathy:     Cervical: No cervical adenopathy.  Skin:    General: Skin is warm and dry.     Capillary Refill: Capillary refill takes less than 2 seconds.  Neurological:     Mental Status: She is alert and oriented to person, place, and time. Mental status is at baseline.  Psychiatric:        Behavior: Behavior normal.      ED Treatments / Results  Labs (all labs ordered are listed, but only abnormal results are displayed) Labs Reviewed - No data to display  EKG None  Radiology Dg Chest 2 View  Result Date: 01/15/2018 CLINICAL DATA:  Cough and fever EXAM: CHEST - 2 VIEW COMPARISON:  August 31, 2008 FINDINGS: There is no appreciable edema or consolidation. The heart size and pulmonary vascularity are normal. No adenopathy. No bone lesions. IMPRESSION: No edema or consolidation. Electronically Signed   By: Bretta Bang III M.D.   On: 01/15/2018 13:14    Procedures Procedures (including critical care time)  Medications Ordered in ED Medications - No data to display   Initial Impression / Assessment and Plan / ED Course  I have reviewed the triage vital signs and the nursing notes.  Pertinent labs & imaging results that were available during my care of the patient were reviewed by me and considered in my medical decision making (see chart for details).  Pt presents with nasal congestion and cough. Pt is well appearing and vitals are normal. Lungs CTA on exam. Pt CXR negative for acute infiltrate. Patients symptoms are consistent with URI,  likely viral etiology. Discussed that antibiotics are not indicated for viral infections. Pt will be discharged with symptomatic treatment.  Verbalizes understanding and is agreeable with plan. Pt is hemodynamically stable & in NAD prior to dc. Return precautions discussed, pt expresses understanding and agrees with plan.   Final Clinical Impressions(s) / ED Diagnoses   Final diagnoses:  Viral URI with cough    ED Discharge Orders         Ordered    benzonatate (TESSALON) 100 MG capsule  Every 8 hours     01/15/18 1435           Dartha Lodge, [place] 01/15/18 1440  Tilden Fossaees, Elizabeth, MD 01/15/18 [phone]

## 2022-03-05 ENCOUNTER — Ambulatory Visit
Admission: EM | Admit: 2022-03-05 | Discharge: 2022-03-05 | Disposition: A | Discharge status: Home / Self Care | Payer: BLUE CROSS/BLUE SHIELD | Attending: Nurse Practitioner | Admitting: Nurse Practitioner

## 2022-03-05 DIAGNOSIS — Z1152 Encounter for screening for COVID-19: Secondary | ICD-10-CM | POA: Diagnosis not present

## 2022-03-05 DIAGNOSIS — J029 Acute pharyngitis, unspecified: Secondary | ICD-10-CM | POA: Diagnosis not present

## 2022-03-05 LAB — POCT RAPID STREP A (OFFICE): Rapid Strep A Screen: NEGATIVE

## 2022-03-05 NOTE — Discharge Instructions (Addendum)
The rapid strep throat test today is negative.  As we discussed, I think the sore throat is most likely caused by a virus.  Symptoms should improve over the next week to 10 days.  We have tested you today for COVID-19.  You will see the results in Mychart and we will call you with positive results.  Please stay home and isolate until you are aware of the results.    Some things that can make you feel better are: - Increased rest - Increasing fluid with water/sugar free electrolytes - Acetaminophen as needed for fever/pain - Salt water gargling, chloraseptic spray and throat lozenges for sore throat

## 2022-03-05 NOTE — ED Triage Notes (Signed)
Pt reports sore throat and fever x 3 days.

## 2022-03-05 NOTE — ED Provider Notes (Signed)
[facility]    CSN: [id number] Arrival date & time: 03/05/22  1034      History   Chief Complaint Chief Complaint  Patient presents with   Sore Throat   Fever    HPI [name] is a 62 y.o. female.   Patient presents today for 3-day history of fever, postnasal drainage and sore throat, and fatigue.  Reports appetite has been decreased because it hurts to swallow.  Patient denies cough, shortness of breath or chest pain, chest congestion or chest tightness, runny nose or stuffy nose, headache, ear pain, abdominal pain, nausea/vomiting, diarrhea, loss of taste or smell, and new rash.  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  Has been taking Advil for her symptoms which does help with the throat and fever.  Patient denies history of chronic lung disease.  Reports she is a current smoker.    History reviewed. No pertinent past medical history.  There are no problems to display for this patient.   History reviewed. No pertinent surgical history.  OB History   No obstetric history on file.      Home Medications    Prior to Admission medications   Medication Sig Start Date End Date Taking? Authorizing Provider  losartan (COZAAR) 100 MG tablet Take 1 tablet by mouth daily. 12/30/21  Yes [provider]    Family History History reviewed. No pertinent family history.  Social History Social History   Tobacco Use   Smoking status: Every Day    Types: Cigarettes   Smokeless tobacco: Never  Vaping Use   Vaping Use: Never used  Substance Use Topics   Alcohol use: Not Currently   Drug use: Never     Allergies   Azithromycin and Other   Review of Systems Review of Systems Per HPI  Physical Exam Triage Vital Signs ED Triage Vitals  Enc Vitals Group     BP 03/05/22 1317 (!) 150/86     Pulse Rate 03/05/22 1317 83     Resp 03/05/22 1317 18     Temp 03/05/22 1317 98.5 F (36.9 C)     Temp Source  03/05/22 1317 Oral     SpO2 03/05/22 1317 93 %     Weight --      Height --      Head Circumference --      Peak Flow --      Pain Score 03/05/22 1321 5     Pain Loc --      Pain Edu? --      Excl. in [place]? --    No data found.  Updated Vital Signs BP (!) 150/86 (BP Location: Right Arm)   Pulse 83   Temp 98.5 F (36.9 C) (Oral)   Resp 18   SpO2 93%   Visual Acuity Right Eye Distance:   Left Eye Distance:   Bilateral Distance:    Right Eye Near:   Left Eye Near:    Bilateral Near:     Physical Exam Vitals and nursing note reviewed.  Constitutional:      General: She is not in acute distress.    Appearance: Normal appearance. She is not ill-appearing or toxic-appearing.  HENT:     Head: Normocephalic and atraumatic.     Right Ear: Tympanic membrane, ear canal and external ear normal. No drainage, swelling or tenderness. No middle ear effusion. Tympanic membrane is not erythematous.     Left  Ear: Tympanic membrane, ear canal and external ear normal. No drainage, swelling or tenderness.  No middle ear effusion. Tympanic membrane is not erythematous.     Nose: No congestion or rhinorrhea.     Mouth/Throat:     Mouth: Mucous membranes are moist.     Pharynx: Oropharynx is clear. Posterior oropharyngeal erythema present. No oropharyngeal exudate.     Tonsils: No tonsillar exudate. 0 on the right. 0 on the left.     Comments: Cobblestoning of posterior pharynx Eyes:     General: No scleral icterus.    Extraocular Movements: Extraocular movements intact.  Cardiovascular:     Rate and Rhythm: Normal rate and regular rhythm.  Pulmonary:     Effort: Pulmonary effort is normal. No respiratory distress.     Breath sounds: Normal breath sounds. No wheezing, rhonchi or rales.  Abdominal:     General: Abdomen is flat. Bowel sounds are normal. There is no distension.     Palpations: Abdomen is soft.  Musculoskeletal:     Cervical back: Normal range of motion and neck supple.   Lymphadenopathy:     Cervical: No cervical adenopathy.  Skin:    General: Skin is warm and dry.     Coloration: Skin is not jaundiced or pale.     Findings: No erythema or rash.  Neurological:     Mental Status: She is alert and oriented to person, place, and time.     Motor: No weakness.      UC Treatments / Results  Labs (all labs ordered are listed, but only abnormal results are displayed) Labs Reviewed  SARS CORONAVIRUS 2 (TAT 6-24 HRS)  POCT RAPID STREP A (OFFICE)    EKG   Radiology No results found.  Procedures Procedures (including critical care time)  Medications Ordered in UC Medications - No data to display  Initial Impression / Assessment and Plan / UC Course  I have reviewed the triage vital signs and the nursing notes.  Pertinent labs & imaging results that were available during my care of the patient were reviewed by me and considered in my medical decision making (see chart for details).   Patient is well-appearing, normotensive, afebrile, not tachycardic, not tachypneic, oxygenating well on room air.   1. Encounter for screening for COVID-19 2. Acute pharyngitis, unspecified etiology Rapid strep test today is negative Suspect viral etiology COVID-19 testing obtained for rule out Supportive care discussed with patient ER and return precautions also discussed with patient Note given for work  The patient was given the opportunity to ask questions.  All questions answered to their satisfaction.  The patient is in agreement to this plan.    Final Clinical Impressions(s) / UC Diagnoses   Final diagnoses:  Encounter for screening for COVID-19  Acute pharyngitis, unspecified etiology     Discharge Instructions      The rapid strep throat test today is negative.  As we discussed, I think the sore throat is most likely caused by a virus.  Symptoms should improve over the next week to 10 days.  We have tested you today for COVID-19.  You will see  the results in Mychart and we will call you with positive results.  Please stay home and isolate until you are aware of the results.    Some things that can make you feel better are: - Increased rest - Increasing fluid with water/sugar free electrolytes - Acetaminophen as needed for fever/pain - Salt water gargling, chloraseptic spray  and throat lozenges for sore throat     ED Prescriptions   None    PDMP not reviewed this encounter.   Eulogio Bear, NP 03/05/22 [phone]

## 2022-03-06 LAB — SARS CORONAVIRUS 2 (TAT 6-24 HRS): SARS Coronavirus 2: NEGATIVE

## 2022-09-19 DIAGNOSIS — H3540 Unspecified peripheral retinal degeneration: Secondary | ICD-10-CM | POA: Diagnosis not present

## 2023-02-02 DIAGNOSIS — I10 Essential (primary) hypertension: Secondary | ICD-10-CM | POA: Diagnosis not present

## 2023-02-02 DIAGNOSIS — S8392XA Sprain of unspecified site of left knee, initial encounter: Secondary | ICD-10-CM | POA: Diagnosis not present

## 2023-04-03 DIAGNOSIS — Z0001 Encounter for general adult medical examination with abnormal findings: Secondary | ICD-10-CM | POA: Diagnosis not present

## 2023-04-03 DIAGNOSIS — E6609 Other obesity due to excess calories: Secondary | ICD-10-CM | POA: Diagnosis not present

## 2023-04-03 DIAGNOSIS — I10 Essential (primary) hypertension: Secondary | ICD-10-CM | POA: Diagnosis not present

## 2023-04-03 DIAGNOSIS — Z6829 Body mass index (BMI) 29.0-29.9, adult: Secondary | ICD-10-CM | POA: Diagnosis not present

## 2023-04-03 DIAGNOSIS — F17200 Nicotine dependence, unspecified, uncomplicated: Secondary | ICD-10-CM | POA: Diagnosis not present

## 2023-04-03 DIAGNOSIS — Z1331 Encounter for screening for depression: Secondary | ICD-10-CM | POA: Diagnosis not present

## 2023-04-06 ENCOUNTER — Other Ambulatory Visit: Payer: Self-pay | Admitting: Family Medicine

## 2023-04-06 DIAGNOSIS — Z1231 Encounter for screening mammogram for malignant neoplasm of breast: Secondary | ICD-10-CM

## 2023-04-10 ENCOUNTER — Ambulatory Visit
Admission: RE | Admit: 2023-04-10 | Discharge: 2023-04-10 | Disposition: A | Discharge status: Home / Self Care | Payer: Self-pay | Source: Ambulatory Visit | Attending: Family Medicine | Admitting: Family Medicine

## 2023-04-10 DIAGNOSIS — Z1231 Encounter for screening mammogram for malignant neoplasm of breast: Secondary | ICD-10-CM

## 2023-04-16 ENCOUNTER — Other Ambulatory Visit (HOSPITAL_COMMUNITY): Payer: Self-pay | Admitting: Family Medicine

## 2023-04-16 DIAGNOSIS — F17200 Nicotine dependence, unspecified, uncomplicated: Secondary | ICD-10-CM

## 2023-04-26 ENCOUNTER — Ambulatory Visit (HOSPITAL_COMMUNITY)
Admission: RE | Admit: 2023-04-26 | Discharge: 2023-04-26 | Disposition: A | Discharge status: Home / Self Care | Source: Ambulatory Visit | Attending: Family Medicine | Admitting: Family Medicine

## 2023-04-26 DIAGNOSIS — F17210 Nicotine dependence, cigarettes, uncomplicated: Secondary | ICD-10-CM | POA: Diagnosis not present

## 2023-04-26 DIAGNOSIS — F17200 Nicotine dependence, unspecified, uncomplicated: Secondary | ICD-10-CM | POA: Diagnosis not present

## 2023-04-27 ENCOUNTER — Other Ambulatory Visit: Payer: Self-pay | Admitting: *Deleted

## 2023-04-27 DIAGNOSIS — Z1211 Encounter for screening for malignant neoplasm of colon: Secondary | ICD-10-CM

## 2023-04-30 ENCOUNTER — Ambulatory Visit: Admitting: General Surgery

## 2023-04-30 ENCOUNTER — Encounter: Payer: Self-pay | Admitting: General Surgery

## 2023-04-30 VITALS — BP 175/97 | HR 70 | Temp 97.8°F | Resp 14 | Ht 63.0 in | Wt 159.0 lb

## 2023-04-30 DIAGNOSIS — Z1211 Encounter for screening for malignant neoplasm of colon: Secondary | ICD-10-CM | POA: Diagnosis not present

## 2023-04-30 MED ORDER — SUTAB 1479-225-188 MG PO TABS: 24.0000 | ORAL_TABLET | Freq: Once | ORAL | 0 refills | Status: AC

## 2023-04-30 NOTE — H&P (Signed)
 [name]; [id number]; 1960/05/18   HPI Patient is a 63 year old white female who was referred to my care by Terie Purser for evaluation and treatment for a screening colonoscopy.  Patient has never had a colonoscopy.  She has no immediate family history of colon cancer.  She does have a paternal grandfather with a history of colon cancer.  She denies any abdominal pain, weight change, abnormal diarrhea or constipation, or blood in her stools. History reviewed. No pertinent past medical history.  History reviewed. No pertinent surgical history.  Family History  Problem Relation Age of Onset   Breast cancer Maternal Grandmother 26    Current Outpatient Medications on File Prior to Visit  Medication Sig Dispense Refill   losartan (COZAAR) 100 MG tablet Take 1 tablet by mouth daily.     metoprolol succinate (TOPROL-XL) 25 MG 24 hr tablet Take 25 mg by mouth daily.     No current facility-administered medications on file prior to visit.    Allergies  Allergen Reactions   Azithromycin Rash   Other     Unknown abx    Social History   Substance and Sexual Activity  Alcohol Use Not Currently    Social History   Tobacco Use  Smoking Status Every Day   Types: Cigarettes  Smokeless Tobacco Never    Review of Systems  Constitutional: Negative.   HENT: Negative.    Eyes: Negative.   Respiratory: Negative.    Cardiovascular: Negative.   Gastrointestinal: Negative.   Genitourinary: Negative.   Musculoskeletal: Negative.   Skin: Negative.   Neurological: Negative.   Endo/Heme/Allergies: Negative.   Psychiatric/Behavioral: Negative.      Objective   Vitals:   04/30/23 0927  BP: (!) 175/97  Pulse: 70  Resp: 14  Temp: 97.8 F (36.6 C)  SpO2: 96%    Physical Exam Vitals reviewed.  Constitutional:      Appearance: Normal appearance. She is normal weight. She is not ill-appearing.  HENT:     Head: Normocephalic and atraumatic.  Cardiovascular:     Rate  and Rhythm: Normal rate and regular rhythm.     Heart sounds: Normal heart sounds. No murmur heard.    No friction rub. No gallop.  Pulmonary:     Effort: Pulmonary effort is normal. No respiratory distress.     Breath sounds: Normal breath sounds. No stridor. No wheezing, rhonchi or rales.  Abdominal:     General: Bowel sounds are normal. There is no distension.     Palpations: Abdomen is soft. There is no mass.     Tenderness: There is no abdominal tenderness. There is no guarding or rebound.     Hernia: No hernia is present.  Skin:    General: Skin is warm and dry.  Neurological:     Mental Status: She is alert and oriented to person, place, and time.   Primary care notes reviewed  Assessment  Need for screening colonoscopy Plan  Patient is scheduled for screening colonoscopy on 05/19/2023.  The risks and benefits of the procedure including bleeding and perforation were fully explained to the patient, who gave informed consent.  Sutabs have been prescribed preoperatively for bowel preparation.

## 2023-04-30 NOTE — Progress Notes (Signed)
 [name]; [id number]; 1960/05/18   HPI Patient is a 63 year old white female who was referred to my care by Terie Purser for evaluation and treatment for a screening colonoscopy.  Patient has never had a colonoscopy.  She has no immediate family history of colon cancer.  She does have a paternal grandfather with a history of colon cancer.  She denies any abdominal pain, weight change, abnormal diarrhea or constipation, or blood in her stools. History reviewed. No pertinent past medical history.  History reviewed. No pertinent surgical history.  Family History  Problem Relation Age of Onset   Breast cancer Maternal Grandmother 26    Current Outpatient Medications on File Prior to Visit  Medication Sig Dispense Refill   losartan (COZAAR) 100 MG tablet Take 1 tablet by mouth daily.     metoprolol succinate (TOPROL-XL) 25 MG 24 hr tablet Take 25 mg by mouth daily.     No current facility-administered medications on file prior to visit.    Allergies  Allergen Reactions   Azithromycin Rash   Other     Unknown abx    Social History   Substance and Sexual Activity  Alcohol Use Not Currently    Social History   Tobacco Use  Smoking Status Every Day   Types: Cigarettes  Smokeless Tobacco Never    Review of Systems  Constitutional: Negative.   HENT: Negative.    Eyes: Negative.   Respiratory: Negative.    Cardiovascular: Negative.   Gastrointestinal: Negative.   Genitourinary: Negative.   Musculoskeletal: Negative.   Skin: Negative.   Neurological: Negative.   Endo/Heme/Allergies: Negative.   Psychiatric/Behavioral: Negative.      Objective   Vitals:   04/30/23 0927  BP: (!) 175/97  Pulse: 70  Resp: 14  Temp: 97.8 F (36.6 C)  SpO2: 96%    Physical Exam Vitals reviewed.  Constitutional:      Appearance: Normal appearance. She is normal weight. She is not ill-appearing.  HENT:     Head: Normocephalic and atraumatic.  Cardiovascular:     Rate  and Rhythm: Normal rate and regular rhythm.     Heart sounds: Normal heart sounds. No murmur heard.    No friction rub. No gallop.  Pulmonary:     Effort: Pulmonary effort is normal. No respiratory distress.     Breath sounds: Normal breath sounds. No stridor. No wheezing, rhonchi or rales.  Abdominal:     General: Bowel sounds are normal. There is no distension.     Palpations: Abdomen is soft. There is no mass.     Tenderness: There is no abdominal tenderness. There is no guarding or rebound.     Hernia: No hernia is present.  Skin:    General: Skin is warm and dry.  Neurological:     Mental Status: She is alert and oriented to person, place, and time.   Primary care notes reviewed  Assessment  Need for screening colonoscopy Plan  Patient is scheduled for screening colonoscopy on 05/19/2023.  The risks and benefits of the procedure including bleeding and perforation were fully explained to the patient, who gave informed consent.  Sutabs have been prescribed preoperatively for bowel preparation.

## 2023-05-14 DIAGNOSIS — I10 Essential (primary) hypertension: Secondary | ICD-10-CM | POA: Diagnosis not present

## 2023-05-14 DIAGNOSIS — Z6829 Body mass index (BMI) 29.0-29.9, adult: Secondary | ICD-10-CM | POA: Diagnosis not present

## 2023-05-14 DIAGNOSIS — F17200 Nicotine dependence, unspecified, uncomplicated: Secondary | ICD-10-CM | POA: Diagnosis not present

## 2023-05-14 DIAGNOSIS — E663 Overweight: Secondary | ICD-10-CM | POA: Diagnosis not present

## 2023-05-19 ENCOUNTER — Other Ambulatory Visit: Payer: Self-pay

## 2023-05-19 ENCOUNTER — Ambulatory Visit (HOSPITAL_COMMUNITY)
Admission: RE | Admit: 2023-05-19 | Discharge: 2023-05-19 | Disposition: A | Discharge status: Home / Self Care | Attending: General Surgery | Admitting: General Surgery

## 2023-05-19 ENCOUNTER — Ambulatory Visit (HOSPITAL_COMMUNITY): Admitting: Anesthesiology

## 2023-05-19 ENCOUNTER — Encounter (HOSPITAL_COMMUNITY): Payer: Self-pay | Admitting: General Surgery

## 2023-05-19 ENCOUNTER — Encounter (HOSPITAL_COMMUNITY)
Admission: RE | Disposition: A | Discharge status: Home / Self Care | Payer: Self-pay | Source: Home / Self Care | Attending: General Surgery

## 2023-05-19 DIAGNOSIS — F17210 Nicotine dependence, cigarettes, uncomplicated: Secondary | ICD-10-CM | POA: Diagnosis not present

## 2023-05-19 DIAGNOSIS — D124 Benign neoplasm of descending colon: Secondary | ICD-10-CM

## 2023-05-19 DIAGNOSIS — Z1211 Encounter for screening for malignant neoplasm of colon: Secondary | ICD-10-CM | POA: Diagnosis not present

## 2023-05-19 DIAGNOSIS — I10 Essential (primary) hypertension: Secondary | ICD-10-CM | POA: Diagnosis not present

## 2023-05-19 DIAGNOSIS — K635 Polyp of colon: Secondary | ICD-10-CM | POA: Diagnosis not present

## 2023-05-19 DIAGNOSIS — F17200 Nicotine dependence, unspecified, uncomplicated: Secondary | ICD-10-CM | POA: Diagnosis not present

## 2023-05-19 HISTORY — DX: Essential (primary) hypertension: I10

## 2023-05-19 HISTORY — PX: COLONOSCOPY: SHX5424

## 2023-05-19 SURGERY — COLONOSCOPY
Anesthesia: General

## 2023-05-19 MED ORDER — PROPOFOL 500 MG/50ML IV EMUL
INTRAVENOUS | Status: DC | PRN
  Administered 2023-05-19: 150 ug/kg/min via INTRAVENOUS
  Administered 2023-05-19: 80 mg via INTRAVENOUS
  Administered 2023-05-19: 20 mg via INTRAVENOUS

## 2023-05-19 MED ORDER — LACTATED RINGERS IV SOLN: INTRAVENOUS | Status: DC | PRN

## 2023-05-19 NOTE — Transfer of Care (Signed)
 Immediate Anesthesia Transfer of Care Note  Patient: [name]  Procedure(s) Performed: COLONOSCOPY  Patient Location: Endoscopy Unit  Anesthesia Type:General  Level of Consciousness: awake, alert , and oriented  Airway & Oxygen Therapy: Patient Spontanous Breathing  Post-op Assessment: Report given to RN and Post -op Vital signs reviewed and stable  Post vital signs: Reviewed and stable  Last Vitals:  Vitals Value Taken Time  BP 121/57 05/19/23 0802  Temp 36.5 C 05/19/23 0802  Pulse 67 05/19/23 0802  Resp 15 05/19/23 0802  SpO2 94 % 05/19/23 0802    Last Pain:  Vitals:   05/19/23 0802  TempSrc: Oral  PainSc: 0-No pain      Patients Stated Pain Goal: 9 (05/19/23 0720)  Complications: No notable events documented.

## 2023-05-19 NOTE — Op Note (Signed)
 [facility] Patient Name: [name] Procedure Date: 05/19/2023 7:33 AM MRN: [id number] Date of Birth: 09-Apr-1960 Attending MD: [name] , MD, [id number] CSN: [id number] Age: 63 Admit Type: Outpatient Procedure:                Colonoscopy Indications:              Screening for colorectal malignant neoplasm Providers:                [name], MD, Willena Harp, Theola Fitch,                            [place] Wilson, Technician Referring MD:              Medicines:                Propofol  per Anesthesia Complications:            No immediate complications. Estimated Blood Loss:     Estimated blood loss: none. Procedure:                Pre-Anesthesia Assessment:                           - Prior to the procedure, a History and Physical                            was performed, and patient medications and                            allergies were reviewed. The patient is competent.                            The risks and benefits of the procedure and the                            sedation options and risks were discussed with the                            patient. All questions were answered and informed                            consent was obtained. Patient identification and                            proposed procedure were verified by the physician,                            the nurse, the anesthetist and the technician in                            the procedure room. Mental Status Examination:                            alert and oriented. Airway Examination: normal  oropharyngeal airway and neck mobility. Respiratory                            Examination: clear to auscultation. CV Examination:                            RRR, no murmurs, no S3 or S4. Prophylactic                            Antibiotics: The patient does not require                            prophylactic antibiotics. Prior Anticoagulants: The                             patient has taken no anticoagulant or antiplatelet                            agents. ASA Grade Assessment: II - A patient with                            mild systemic disease. After reviewing the risks                            and benefits, the patient was deemed in                            satisfactory condition to undergo the procedure.                            The anesthesia plan was to use deep sedation /                            analgesia. Immediately prior to administration of                            medications, the patient was re-assessed for                            adequacy to receive sedatives. The heart rate,                            respiratory rate, oxygen saturations, blood                            pressure, adequacy of pulmonary ventilation, and                            response to care were monitored throughout the                            procedure. The physical status of the patient was  re-assessed after the procedure.                           After obtaining informed consent, the colonoscope                            was passed under direct vision. Throughout the                            procedure, the patient's blood pressure, pulse, and                            oxygen saturations were monitored continuously. The                            [id number]) scope was introduced through the                            anus and advanced to the the cecum, identified by                            the appendiceal orifice, ileocecal valve and                            palpation. The terminal ileum was photographed. The                            entire colon was well visualized. The colonoscopy                            was performed without difficulty. The quality of                            the bowel preparation was adequate. The patient                            tolerated the procedure well. The total  duration of                            the procedure was 14 minutes. Scope In: 7:41:37 AM Scope Out: 7:57:55 AM Scope Withdrawal Time: 0 hours 10 minutes 59 seconds  Total Procedure Duration: 0 hours 16 minutes 18 seconds  Findings:      The perianal and digital rectal examinations were normal.      A 2 mm polyp was found in the mid descending colon. The polyp was       sessile. The polyp was removed with a hot snare. Resection with hot       snare, but specimen fulgerated and not retrieved. Estimated blood loss:       none.      The entire examined colon appeared normal on direct and retroflexion       views. Impression:               - One 2 mm polyp in the mid descending colon,  removed with a hot snare. Resected and retrieved.                           - The entire examined colon is normal on direct and                            retroflexion views. Moderate Sedation:      Moderate (conscious) sedation was administered by the nurse and       supervised by the endoscopist. The patient's oxygen saturation, heart       rate, blood pressure and response to care were monitored. Recommendation:           - Written discharge instructions were provided to                            the patient.                           - The signs and symptoms of potential delayed                            complications were discussed with the patient.                           - Patient has a contact number available for                            emergencies.                           - Return to normal activities tomorrow.                           - Resume previous diet.                           - Continue present medications.                           - Repeat colonoscopy in 5 years for surveillance. Procedure Code(s):        --- Professional ---                           [phone], Colonoscopy, flexible; with removal of                            tumor(s), polyp(s), or  other lesion(s) by snare                            technique Diagnosis Code(s):        --- Professional ---                           Z12.11, Encounter for screening for malignant                            neoplasm  of colon                           D12.4, Benign neoplasm of descending colon CPT copyright 2022 [employer]. All rights reserved. The codes documented in this report are preliminary and upon coder review may  be revised to meet current compliance requirements. [name], MD [name], MD 05/19/2023 8:15:04 AM This report has been signed electronically. Number of Addenda: 0

## 2023-05-19 NOTE — Interval H&P Note (Signed)
 History and Physical Interval Note:  05/19/2023 7:38 AM  [name]  has presented today for surgery, with the diagnosis of SCREENING FOR COLON CANCER.  The various methods of treatment have been discussed with the patient and family. After consideration of risks, benefits and other options for treatment, the patient has consented to  Procedure(s) with comments: COLONOSCOPY (N/A) - W/ PROPOFOL  as a surgical intervention.  The patient's history has been reviewed, patient examined, no change in status, stable for surgery.  I have reviewed the patient's chart and labs.  Questions were answered to the patient's satisfaction.     [name]

## 2023-05-19 NOTE — Anesthesia Preprocedure Evaluation (Signed)
 Anesthesia Evaluation  Patient identified by MRN, date of birth, ID band Patient awake    Reviewed: Allergy & Precautions, H&P , NPO status , Patient's Chart, lab work & pertinent test results, reviewed documented beta blocker date and time   Airway Mallampati: II  TM Distance: >3 FB Neck ROM: full    Dental no notable dental hx.    Pulmonary neg pulmonary ROS, Current Smoker   Pulmonary exam normal breath sounds clear to auscultation       Cardiovascular Exercise Tolerance: Good hypertension, negative cardio ROS  Rhythm:regular Rate:Normal     Neuro/Psych negative neurological ROS  negative psych ROS   GI/Hepatic negative GI ROS, Neg liver ROS,,,  Endo/Other  negative endocrine ROS    Renal/GU negative Renal ROS  negative genitourinary   Musculoskeletal   Abdominal   Peds  Hematology negative hematology ROS (+)   Anesthesia Other Findings   Reproductive/Obstetrics negative OB ROS                             Anesthesia Physical Anesthesia Plan  ASA: 2  Anesthesia Plan: General   Post-op Pain Management:    Induction:   PONV Risk Score and Plan: Propofol infusion  Airway Management Planned:   Additional Equipment:   Intra-op Plan:   Post-operative Plan:   Informed Consent: I have reviewed the patients History and Physical, chart, labs and discussed the procedure including the risks, benefits and alternatives for the proposed anesthesia with the patient or authorized representative who has indicated his/her understanding and acceptance.     Dental Advisory Given  Plan Discussed with: CRNA  Anesthesia Plan Comments:        Anesthesia Quick Evaluation

## 2023-05-20 ENCOUNTER — Encounter (HOSPITAL_COMMUNITY): Payer: Self-pay | Admitting: General Surgery

## 2023-05-20 LAB — SURGICAL PATHOLOGY

## 2023-05-22 NOTE — Anesthesia Postprocedure Evaluation (Signed)
 Anesthesia Post Note  Patient: [name]  Procedure(s) Performed: COLONOSCOPY  Patient location during evaluation: Phase II Anesthesia Type: General Level of consciousness: awake Pain management: pain level controlled Vital Signs Assessment: post-procedure vital signs reviewed and stable Respiratory status: spontaneous breathing and respiratory function stable Cardiovascular status: blood pressure returned to baseline and stable Postop Assessment: no headache and no apparent nausea or vomiting Anesthetic complications: no Comments: Late entry   No notable events documented.   Last Vitals:  Vitals:   05/19/23 0720 05/19/23 0802  BP: (!) 185/81 (!) 121/57  Pulse: 72 67  Resp: 20 15  Temp: 36.6 C 36.5 C  SpO2: 94% 94%    Last Pain:  Vitals:   05/19/23 0802  TempSrc: Oral  PainSc: 0-No pain                 Coretha Dew

## 2023-09-22 DIAGNOSIS — H35033 Hypertensive retinopathy, bilateral: Secondary | ICD-10-CM | POA: Diagnosis not present
